# Patient Record
Sex: Female | Born: 1975
Health system: Southern US, Community
[De-identification: ages and names within clinical notes are randomized; demographics above are authoritative.]

## PROBLEM LIST (undated history)

## (undated) DIAGNOSIS — D649 Anemia, unspecified: Secondary | ICD-10-CM

## (undated) HISTORY — DX: Anemia, unspecified: D64.9

---

## 2007-06-23 HISTORY — PX: TUBAL LIGATION: SHX77

## 2015-08-25 ENCOUNTER — Ambulatory Visit (INDEPENDENT_AMBULATORY_CARE_PROVIDER_SITE_OTHER): Payer: BLUE CROSS/BLUE SHIELD | Admitting: Family Medicine

## 2015-08-25 ENCOUNTER — Encounter: Payer: Self-pay | Admitting: Family Medicine

## 2015-08-25 VITALS — BP 132/86 | HR 92 | Temp 98.5°F | Resp 20 | Ht 63.5 in | Wt 128.0 lb

## 2015-08-25 DIAGNOSIS — Z1239 Encounter for other screening for malignant neoplasm of breast: Secondary | ICD-10-CM

## 2015-08-25 DIAGNOSIS — Z23 Encounter for immunization: Secondary | ICD-10-CM

## 2015-08-25 DIAGNOSIS — D649 Anemia, unspecified: Secondary | ICD-10-CM | POA: Insufficient documentation

## 2015-08-25 DIAGNOSIS — Z0000 Encounter for general adult medical examination without abnormal findings: Secondary | ICD-10-CM | POA: Diagnosis not present

## 2015-08-25 DIAGNOSIS — K649 Unspecified hemorrhoids: Secondary | ICD-10-CM | POA: Insufficient documentation

## 2015-08-25 DIAGNOSIS — L659 Nonscarring hair loss, unspecified: Secondary | ICD-10-CM | POA: Diagnosis not present

## 2015-08-25 DIAGNOSIS — Z7189 Other specified counseling: Secondary | ICD-10-CM

## 2015-08-25 DIAGNOSIS — Z131 Encounter for screening for diabetes mellitus: Secondary | ICD-10-CM | POA: Insufficient documentation

## 2015-08-25 DIAGNOSIS — Z7689 Persons encountering health services in other specified circumstances: Secondary | ICD-10-CM | POA: Insufficient documentation

## 2015-08-25 LAB — CBC WITH DIFFERENTIAL/PLATELET
Basophils Absolute: 0 10*3/uL (ref 0.0–0.1)
Basophils Relative: 0.6 % (ref 0.0–3.0)
Eosinophils Absolute: 0.1 10*3/uL (ref 0.0–0.7)
Eosinophils Relative: 1 % (ref 0.0–5.0)
HCT: 35.3 % — ABNORMAL LOW (ref 36.0–46.0)
Hemoglobin: 11 g/dL — ABNORMAL LOW (ref 12.0–15.0)
Lymphocytes Relative: 24.6 % (ref 12.0–46.0)
Lymphs Abs: 1.6 10*3/uL (ref 0.7–4.0)
MCHC: 31.1 g/dL (ref 30.0–36.0)
MCV: 76.6 fl — ABNORMAL LOW (ref 78.0–100.0)
Monocytes Absolute: 0.4 10*3/uL (ref 0.1–1.0)
Monocytes Relative: 7 % (ref 3.0–12.0)
Neutro Abs: 4.3 10*3/uL (ref 1.4–7.7)
Neutrophils Relative %: 66.8 % (ref 43.0–77.0)
Platelets: 330 10*3/uL (ref 150.0–400.0)
RBC: 4.61 Mil/uL (ref 3.87–5.11)
RDW: 19.6 % — ABNORMAL HIGH (ref 11.5–15.5)
WBC: 6.4 10*3/uL (ref 4.0–10.5)

## 2015-08-25 LAB — COMPREHENSIVE METABOLIC PANEL
ALT: 9 U/L (ref 0–35)
AST: 14 U/L (ref 0–37)
Albumin: 4.4 g/dL (ref 3.5–5.2)
Alkaline Phosphatase: 54 U/L (ref 39–117)
BUN: 8 mg/dL (ref 6–23)
CO2: 26 mEq/L (ref 19–32)
Calcium: 9.5 mg/dL (ref 8.4–10.5)
Chloride: 105 mEq/L (ref 96–112)
Creatinine, Ser: 0.75 mg/dL (ref 0.40–1.20)
GFR: 90.65 mL/min (ref >60.00)
Glucose, Bld: 80 mg/dL (ref 70–99)
Potassium: 4.4 mEq/L (ref 3.5–5.1)
Sodium: 140 mEq/L (ref 135–145)
Total Bilirubin: 0.4 mg/dL (ref 0.2–1.2)
Total Protein: 7.3 g/dL (ref 6.0–8.3)

## 2015-08-25 LAB — VITAMIN D 25 HYDROXY (VIT D DEFICIENCY, FRACTURES): VITD: 32.41 ng/mL (ref 30.00–100.00)

## 2015-08-25 LAB — TSH: TSH: 1.14 u[IU]/mL (ref 0.35–4.50)

## 2015-08-25 LAB — VITAMIN B12: Vitamin B-12: 248 pg/mL (ref 211–911)

## 2015-08-25 MED ORDER — HYDROCORTISONE ACETATE 25 MG RE SUPP: 25.0000 mg | Freq: Two times a day (BID) | RECTAL | Status: DC

## 2015-08-25 MED ORDER — HYDROCORTISONE 2.5 % RE CREA: 1.0000 "application " | TOPICAL_CREAM | Freq: Two times a day (BID) | RECTAL | Status: DC

## 2015-08-25 NOTE — Progress Notes (Signed)
Subjective:    Patient ID: Ashlee Bean, female    DOB: Apr 13, 1976, 39 y.o.   MRN: [id number]  HPI  Patient presents for new patient establishment with multiple complaints. All past medical history, surgical history, allergies, family history, immunizations and social history was obtained from the patient today and entered into the electronic medical record. Records are requested from her prior PCP, and will be reviewed at the time they are received. All medical records will be updated at that time.  Irregular menstrual cycle: Patient states she normally has a regular menstrual cycle, occurring once per month. However she states last month she had 2 menstrual cycles. She is not on any birth control.  Hemorrhoids/fecal incontinence: Patient states that her rectum feels agitated periodically. She has had runny since her last pregnancy, and frequently uses Preparation H wipes. She reports she has noticed sometimes with urination, she'll notice stool in the toilet and did not realize she had a bowel movement. She has also noticed some fecal incontinence on her underwear as well. She endorses some bright red blood after bowel movements, on her toilet paper for that she associated with her hemorrhoid. HAs had some in her pants as well withour realizing, sees some blood from hemorrhoid. Patient has had one vaginal delivery approximately 14 years ago. She states the baby was large and she did have a large vaginal tear. She has been followed by gynecology routinely since, last seen in 2013. She does not recall having rectal exams during her gynecological exams. She had 2 C-sections following her vaginal birth. She reports she has 3 loose stools daily, and this is normal for her.  Anemia: Patient states she has a history of anemia. She has been unable to donate blood on a few occasions secondary to anemia. She has noticed over the past few months increasing fatigue and hair loss. She denies any chest pain,  shortness of breath or dizziness.  Health maintenance:  Colonoscopy: no fhx, routine screening at age 49 Mammogram: Breast cancer mother at age 2, age 52 screen. Ordered today. Cervical cancer screening:2013, no abnormal. No procedures. Patient currently on her menses, will perform gynecological exam with cervical screening and a separate appointment. Immunizations: Tetanus and flu indicated. Infectious disease screening: HIV indicated.   Past Medical History  Diagnosis Date  . Anemia    Allergies  Allergen Reactions  . Sulfa Antibiotics Nausea Only  . Tape Rash   Past Surgical History  Procedure Laterality Date  . Cesarean section     Family History  Problem Relation Age of Onset  . Breast cancer Mother   . Lung cancer Mother   . Diabetes Father   . Throat cancer Maternal Uncle   . Diabetes Paternal Aunt   . Diabetes Paternal Uncle   . Cancer Maternal Grandmother   . Prostate cancer Maternal Grandfather   . Diabetes Paternal Grandmother   . Heart disease Paternal Grandfather    Social History   Social History  . Marital Status: Married    Spouse Name: N/A  . Number of Children: N/A  . Years of Education: N/A   Occupational History  . Not on file.   Social History Main Topics  . Smoking status: Never Smoker   . Smokeless tobacco: Not on file  . Alcohol Use: 0.6 oz/week    1 Standard drinks or equivalent per week  . Drug Use: No  . Sexual Activity:    Partners: Male   Other Topics Concern  .  Not on file   Social History Narrative  . No narrative on file    Review of Systems Negative, with the exception of above mentioned in HPI     Objective:   Physical Exam BP 132/86 mmHg  Pulse 92  Temp(Src) 98.5 F (36.9 C)  Resp 20  Ht 5' 3.5" (1.613 m)  SpO2 100%  LMP 08/24/2015  Body mass index is 22.32 kg/(m^2). Gen: Afebrile. No acute distress. Nontoxic in appearance, well-developed, well-nourished, Caucasian female. Very pleasant. HENT: AT. [place].  Bilateral TM visualized and normal in appearance. MMM, no oral lesions. Bilateral nares without erythema or swelling. Throat without erythema or exudates. No cough on exam, no hoarseness on exam. Good dentition. Eyes:Pupils Equal Round Reactive to light, Extraocular movements intact,  Conjunctiva without redness, discharge or icterus. Neck/lymp/endocrine: Supple, no lymphadenopathy, no thyromegaly CV: RRR no murmur appreciated, no edema, +2/4 P posterior tibialis pulses Chest: CTAB, no wheeze or crackles Abd: Soft. Flat. NTND. BS present. No Masses palpated. No hepatosplenomegaly. No rebound tenderness, no guarding. MSK: No erythema, no soft tissue swelling, no obvious deformities. Full range of motion. Skin: No rashes, purpura or petechiae.  Neuro: Normal gait. PERLA. EOMi. Alert. Oriented x3 Cranial nerves II through XII intact. Muscle strength 5/5 upper and lower extremity. DTRs equal bilaterally. Psych: Normal affect, dress and demeanor. Normal speech. Normal thought content and judgment     Assessment & Plan:  Ashlee Bean is a 39 y.o. presents for new patient establishment, with preventative exam, multiple complaints. 1. Hemorrhoids, unspecified hemorrhoid type - Patient with history of hemorrhoids. Concern with what sounds like fecal incontinence versus fistula. Hygiene encouraged, continue sitz bath. Patient given prescription for Anusol rectal cream and suppository to treat hemorrhoid. She is to follow-up within 3-4 weeks to have gynecological exam, and which we will also evaluate with rectal exam. - hydrocortisone (ANUSOL-HC) 2.5 % rectal cream; Place 1 application rectally 2 (two) times daily.  Dispense: 30 g; Refill: 0 - hydrocortisone (ANUSOL-HC) 25 MG suppository; Place 1 suppository (25 mg total) rectally 2 (two) times daily.  Dispense: 12 suppository; Refill: 0 - Patient was encouraged to take a probiotic, ALign  2. Anemia, unspecified anemia type - Patient currently not taking  iron - CBC w/Diff - Comp Met (CMET)  3. Breast cancer screening - Early mammograms coming indicated with family history. Mammogram ordered today.  4. Hair loss - Vitamin D (25 hydroxy) - B12 - TSH  5. Need for prophylactic vaccination and inoculation against influenza - Flu Vaccine QUAD 36+ mos PF IM (Fluarix & Fluzone Quad PF)  6. Need for prophylactic vaccination with combined diphtheria-tetanus-pertussis (DTP) vaccine - Tdap vaccine greater than or equal to 7yo IM  7. Encounter to establish care/ Visit for preventive health examination - Health maintenance:  Colonoscopy: no fhx, routine screening at age 53 Mammogram: Breast cancer mother at age 20, age 50 screen. Ordered today. Cervical cancer screening:2013, no abnormal. No procedures. Patient currently on her menses, will perform gynecological exam with cervical screening and a separate appointment. Immunizations: Tetanus and flu administered today. Infectious disease screening: HIV indicated. - Fasting lipids placed today, patient to make a lab appointment to have this collected.  AVS on health maintenance provided to patient today.  3-4 weeks for routine gynecological screening, with cervical cancer screening and rectal exam.

## 2015-08-25 NOTE — Patient Instructions (Signed)
Align probiotic for 8 weeks.  I will call you with the labs one they are resulted.  Make an appt for fasting lipids to be drawn at your convenience.  I will call in a cream an ointment for your hemorrhoid.  Appt in 1 month for PAP and rectal exam.  Mammogram ordered.

## 2015-08-26 ENCOUNTER — Telehealth: Payer: Self-pay | Admitting: Family Medicine

## 2015-08-26 DIAGNOSIS — E538 Deficiency of other specified B group vitamins: Secondary | ICD-10-CM

## 2015-08-26 DIAGNOSIS — E559 Vitamin D deficiency, unspecified: Secondary | ICD-10-CM

## 2015-08-26 MED ORDER — FERROUS SULFATE 325 (65 FE) MG PO TBEC: 325.0000 mg | DELAYED_RELEASE_TABLET | Freq: Three times a day (TID) | ORAL | Status: DC

## 2015-08-26 MED ORDER — VITAMIN B-12 1000 MCG PO TABS: ORAL_TABLET | ORAL | Status: DC

## 2015-08-26 MED ORDER — CHOLECALCIFEROL 1.25 MG (50000 UT) PO CAPS: 50000.0000 [IU] | ORAL_CAPSULE | ORAL | Status: DC

## 2015-08-26 NOTE — Telephone Encounter (Signed)
spoke with patient reviewed lab results and instructions. Patient verbalized understanding.

## 2015-08-26 NOTE — Telephone Encounter (Signed)
Please call pt:  - Her b12 is 248, this is rather low. - She is mildly anemic (11) - Her vit D is low normal.  - her thyroid is normal - all of the above can cause some of her symptoms. Stress can also cause some of her symptoms as well as hair loss and irregular menses.  - Pt was encourage to make an appt within 3-4 weeks for gyn/PAP exam, we will discuss labs and irregular menses in more detail at that time.  - I have called in iron supplementation for her to take TID. I have also called in b12 and vit D supplements (instructions on label), we will need to re-check these levels in 12 weeks by lab appt only. Orders placed.

## 2015-08-28 ENCOUNTER — Other Ambulatory Visit: Payer: BLUE CROSS/BLUE SHIELD

## 2015-09-02 ENCOUNTER — Encounter: Payer: Self-pay | Admitting: Family Medicine

## 2015-09-16 ENCOUNTER — Other Ambulatory Visit (HOSPITAL_COMMUNITY)
Admission: RE | Admit: 2015-09-16 | Discharge: 2015-09-16 | Disposition: A | Discharge status: Home / Self Care | Payer: BLUE CROSS/BLUE SHIELD | Source: Ambulatory Visit | Attending: Family Medicine | Admitting: Family Medicine

## 2015-09-16 ENCOUNTER — Encounter: Payer: Self-pay | Admitting: Family Medicine

## 2015-09-16 ENCOUNTER — Ambulatory Visit (INDEPENDENT_AMBULATORY_CARE_PROVIDER_SITE_OTHER): Payer: BLUE CROSS/BLUE SHIELD | Admitting: Family Medicine

## 2015-09-16 VITALS — BP 127/89 | HR 83 | Temp 98.2°F | Resp 20 | Wt 128.8 lb

## 2015-09-16 DIAGNOSIS — Z1151 Encounter for screening for human papillomavirus (HPV): Secondary | ICD-10-CM | POA: Insufficient documentation

## 2015-09-16 DIAGNOSIS — Z0000 Encounter for general adult medical examination without abnormal findings: Secondary | ICD-10-CM

## 2015-09-16 DIAGNOSIS — Z01419 Encounter for gynecological examination (general) (routine) without abnormal findings: Secondary | ICD-10-CM | POA: Diagnosis not present

## 2015-09-16 DIAGNOSIS — Z124 Encounter for screening for malignant neoplasm of cervix: Secondary | ICD-10-CM

## 2015-09-16 DIAGNOSIS — Z114 Encounter for screening for human immunodeficiency virus [HIV]: Secondary | ICD-10-CM | POA: Diagnosis not present

## 2015-09-16 DIAGNOSIS — Z01411 Encounter for gynecological examination (general) (routine) with abnormal findings: Secondary | ICD-10-CM | POA: Insufficient documentation

## 2015-09-16 LAB — LIPID PANEL
Cholesterol: 203 mg/dL — ABNORMAL HIGH (ref 0–200)
HDL: 53.9 mg/dL (ref >39.00)
LDL Cholesterol: 119 mg/dL — ABNORMAL HIGH (ref 0–99)
NonHDL: 149.29
Total CHOL/HDL Ratio: 4
Triglycerides: 151 mg/dL — ABNORMAL HIGH (ref 0.0–149.0)
VLDL: 30.2 mg/dL (ref 0.0–40.0)

## 2015-09-16 NOTE — Patient Instructions (Signed)
I did not see a fistula formation. Your gynecological exam today appeared normal. We will call with your PAP results and labs once available.  You do have a hemorrhoid that is likely irritated. If ypu experience more fecal incontinence, then I would would refer you to a specialist.

## 2015-09-16 NOTE — Progress Notes (Signed)
Subjective:    Patient ID: Ashlee Bean, female    DOB: 1976/01/11, 40 y.o.   MRN: [id number]  HPI   Patient presents for scheduled office visit today for cervical cancer screening/routine gynecological exam.  Well woman exam: Patient states that her menstrual cycles have always been approximate one per month, except for the month of November she did have 2 menstrual cycles, since then she believes that her cycles are regulating back out. She is not on any form of birth control. Patient's last Pap was in 2013, she's had no abnormal Paps in the past. Patient has her mammogram scheduled in 3 days, her mother has a history of breast cancer at age 15. Patient denies any vaginal discharge, dyspareunia or vaginal irritation. Patient has not been screened for HIV. Patient is a married gravida 3 para 3, with 1 vaginal delivery and 2 C-sections following. Patient has had recent hemorrhoid flares with mild fecal incontinence. She was prescribed Anusol suppositories and creams on her last visit, and she states it has improved. She now is only seeing blood when she wipes if she's had multiple stools in a day, and that has slowed down. Patient denies any continued fecal incontinence.  Patient does not perform self breast exams.   Past Medical History  Diagnosis Date  . Anemia     after delivery; sometimes also could not give blood.    Allergies  Allergen Reactions  . Sulfa Antibiotics Nausea Only  . Tape Rash   Social History  Substance Use Topics  . Smoking status: Never Smoker   . Smokeless tobacco: None  . Alcohol Use: 0.6 oz/week    1 Standard drinks or equivalent per week     Review of Systems Negative, with the exception of above mentioned in HPI     Objective:   Physical Exam BP 127/89 mmHg  Pulse 83  Temp(Src) 98.2 F (36.8 C)  Resp 20  Wt 128 lb 12 oz (58.401 kg)  SpO2 99%  LMP 08/24/2015  Body mass index is 22.45 kg/(m^2). Gen: Afebrile. No acute distress. Nontoxic in  appearance, well-developed, well-nourished, Caucasian female. Pleasant. HENT: AT. [place].  MMM.  Eyes:Pupils Equal Round Reactive to light, Extraocular movements intact,  Conjunctiva without redness, discharge or icterus. Abd: Soft. Flat. Nontender, nondistended. Bowel sounds present. No masses palpated. No Rebound or guarding.  Skin: No rashes, purpura or petechiae.  Breasts: breasts appear normal, symmetrical, no tenderness on exam, no suspicious masses, no skin or nipple changes or axillary nodes. GYN:  External genitalia within normal limits, normal hair distribution, no lesions. Urethral meatus normal, no lesions. Vaginal mucosa pink, moist, normal rugae, no lesions. No cystocele or rectocele. Nonfriable cervix without lesions, 2 nabothian cyst present, no discharge,  or bleeding noted on speculum exam.  Bimanual exam revealed nongravid uterus.  No bladder/suprapubic fullness, masses or tenderness. No cervical motion tenderness. No adnexal tenderness or fullness. Anus and perineum 2 small external hemorrhoids, without erythema, swelling or drainage. No bleeding. Rectal exam within normal limits, no fistula formation.     Assessment & Plan:  1. Screening for HIV (human immunodeficiency virus) - Patient amendable to testing today - HIV antibody (with reflex)  2. Encounter for gynecological examination - Pap sent with HPV testing; normal appearing exam - Cytology - PAP  3. Visit for preventive health examination - Lipid panel from prior future collection, patient had forgotten to have collected her to visit. - Lipid panel  4. Hemorrhoids: 2 small external hemorrhoids do  not appear inflamed or bleeding. Rectal exam within normal limits. Continue hemorrhoid treatment as needed. Stool should remain soft but not diarrhea.

## 2015-09-17 LAB — CYTOLOGY - PAP

## 2015-09-17 LAB — HIV ANTIBODY (ROUTINE TESTING W REFLEX): HIV 1&2 Ab, 4th Generation: NONREACTIVE

## 2015-09-18 ENCOUNTER — Telehealth: Payer: Self-pay | Admitting: Family Medicine

## 2015-09-18 ENCOUNTER — Ambulatory Visit
Admission: RE | Admit: 2015-09-18 | Discharge: 2015-09-18 | Disposition: A | Discharge status: Home / Self Care | Payer: BLUE CROSS/BLUE SHIELD | Source: Ambulatory Visit | Attending: Family Medicine | Admitting: Family Medicine

## 2015-09-18 DIAGNOSIS — K649 Unspecified hemorrhoids: Secondary | ICD-10-CM

## 2015-09-18 DIAGNOSIS — R159 Full incontinence of feces: Secondary | ICD-10-CM

## 2015-09-18 DIAGNOSIS — D649 Anemia, unspecified: Secondary | ICD-10-CM

## 2015-09-18 DIAGNOSIS — Z1239 Encounter for other screening for malignant neoplasm of breast: Secondary | ICD-10-CM

## 2015-09-18 NOTE — Telephone Encounter (Signed)
Please call pt: - Her PAP showed some changes. It is not positive for any type of cancers. When a PAP comes back with "ASC-US" (atypical squamous cells of undetermined significance)" and a negative HPV co-testing (which she had), the recommendation is repeat PAP at one year.  - Her vit D was low normal (32) she should take a daily OTC vit D daily (800- 1000u).

## 2015-09-18 NOTE — Telephone Encounter (Signed)
Pt has had difficulties with mild anemia (chronic), iron deficiency and fecal incontinence with hemorrhoids.  - No fhx of colon cancer.  - refer to GI for further eval and treatment

## 2015-09-18 NOTE — Telephone Encounter (Signed)
Referral in place

## 2015-09-18 NOTE — Telephone Encounter (Signed)
Spoke with patient reviewed PAP results and instructions to take OTC vit D after completing the D 50,000 . Patient verbalized understanding of all instructions. Patient is requesting referral to GI as discussed in her last OV.Please advise

## 2015-09-21 ENCOUNTER — Encounter: Payer: Self-pay | Admitting: Gastroenterology

## 2015-09-21 ENCOUNTER — Other Ambulatory Visit: Payer: Self-pay | Admitting: Family Medicine

## 2015-09-21 DIAGNOSIS — R928 Other abnormal and inconclusive findings on diagnostic imaging of breast: Secondary | ICD-10-CM

## 2015-09-24 ENCOUNTER — Ambulatory Visit
Admission: RE | Admit: 2015-09-24 | Discharge: 2015-09-24 | Disposition: A | Discharge status: Home / Self Care | Payer: BLUE CROSS/BLUE SHIELD | Source: Ambulatory Visit | Attending: Family Medicine | Admitting: Family Medicine

## 2015-09-24 DIAGNOSIS — R928 Other abnormal and inconclusive findings on diagnostic imaging of breast: Secondary | ICD-10-CM | POA: Insufficient documentation

## 2015-11-10 ENCOUNTER — Other Ambulatory Visit (INDEPENDENT_AMBULATORY_CARE_PROVIDER_SITE_OTHER): Payer: BLUE CROSS/BLUE SHIELD

## 2015-11-10 ENCOUNTER — Ambulatory Visit (INDEPENDENT_AMBULATORY_CARE_PROVIDER_SITE_OTHER): Payer: BLUE CROSS/BLUE SHIELD | Admitting: Gastroenterology

## 2015-11-10 ENCOUNTER — Encounter: Payer: Self-pay | Admitting: Gastroenterology

## 2015-11-10 VITALS — BP 122/84 | HR 70 | Ht 63.0 in | Wt 158.8 lb

## 2015-11-10 DIAGNOSIS — D509 Iron deficiency anemia, unspecified: Secondary | ICD-10-CM | POA: Diagnosis not present

## 2015-11-10 DIAGNOSIS — R197 Diarrhea, unspecified: Secondary | ICD-10-CM

## 2015-11-10 LAB — IGA: IgA: 174 mg/dL (ref 68–378)

## 2015-11-10 MED ORDER — HYOSCYAMINE SULFATE ER 0.375 MG PO TB12: 0.3750 mg | ORAL_TABLET | Freq: Two times a day (BID) | ORAL | Status: DC

## 2015-11-10 NOTE — Patient Instructions (Addendum)
Food Guidelines for a sensitive stomach  Many people have difficulty digesting certain foods, causing a variety of distressing and embarrassing symptoms such as abdominal pain, bloating and gas.  These foods may need to be avoided or consumed in small amounts.  Here are some tips that might be helpful for you.  1.   Lactose intolerance is the difficulty or complete inability to digest lactose, the natural sugar in milk and anything made from milk.  This condition is harmless, common, and can begin any time during life.  Some people can digest a modest amount of lactose while others cannot tolerate any.  Also, not all dairy products contain equal amounts of lactose.  For example, hard cheeses such as parmesan have less lactose than soft cheeses such as cheddar.  Yogurt has less lactose than milk or cheese.  Many packaged foods (even many brands of bread) have milk, so read ingredient lists carefully.  It is difficult to test for lactose intolerance, so just try avoiding lactose as much as possible for a week and see what happens with your symptoms.  If you seem to be lactose intolerant, the best plan is to avoid it (but make sure you get calcium from another source).  The next best thing is to use lactase enzyme supplements, available over the counter everywhere.  Just know that many lactose intolerant people need to take several tablets with each serving of dairy to avoid symptoms.  Lastly, a lot of restaurant food is made with milk or butter.  Many are things you might not suspect, such as mashed potatoes, rice and pasta (cooked with butter) and "grilled" items.  If you are lactose intolerant, it never hurts to ask your server what has milk or butter.  2.   Fiber is an important part of your diet, but not all fiber is well-tolerated.  Insoluble fiber such as bran is often consumed by normal gut bacteria and converted into gas.  Soluble fiber such as oats, squash, carrots and green beans are typically  tolerated better.  3.   Some types of carbohydrates can be poorly digested.  Examples include: fructose (apples, cherries, pears, raisins and other dried fruits), fructans (onions, zucchini, large amounts of wheat), sorbitol/mannitol/xylitol and sucralose/Splenda (common artificial sweeteners), and raffinose (lentils, broccoli, cabbage, asparagus, brussel sprouts, many types of beans).  Do a Development worker, community for [employer] and you will find helpful information. Beano, a dietary supplement, will often help with raffinose-containing foods.  As with lactase tablets, you may need several per serving.  4.   Whenever possible, avoid processed food&meats and chemical additives.  High fructose corn syrup, a common sweetener, may be difficult to digest.  Eggs and soy (comes from the soybean, and added to many foods now) are the other most common bloating/gassy foods.  - Dr. [facility]  Your physician has requested that you go to the basement for the following lab work before leaving today:IGA. TTG  Thank you for choosing  GI  Dr Wilfrid Lund III

## 2015-11-10 NOTE — Progress Notes (Signed)
Gastroenterology and Hepatology Consult Note:  History: Ashlee Bean 11/10/2015  Referring physician: Howard Pouch, DO  Reason for consult/chief complaint: Bowel habit change; Diarrhea; and fecal urgency   Subjective HPI:  This is the initial office consult for a 40 year old woman who complains of lower abdominal pain diarrhea and urgency. She describes about 4-6 months of these symptoms, they were of unclear onset. In retrospect, she thinks it is been going on for years to a lesser degree, but just worse in the last several months. She's had occasional blood on the paper but no frank rectal bleeding. She was especially bothered by an episode of incontinence because she could not get to the toilet on time after feeling urgency for a BM. She has tried no particular dietary changes are meds for this. She has some bandlike crampy abdominal pain that comes and goes without clear triggers or relieving factors. Generally, appetite is been good and weight stable. She denies dysphagia odynophagia or early satiety nausea or vomiting. ROS:  Review of Systems  Constitutional: Negative for appetite change and unexpected weight change.  HENT: Negative for mouth sores and voice change.   Eyes: Negative for pain and redness.  Respiratory: Negative for cough and shortness of breath.   Cardiovascular: Negative for chest pain and palpitations.  Genitourinary: Positive for menstrual problem. Negative for dysuria and hematuria.  Musculoskeletal: Negative for myalgias and arthralgias.  Skin: Negative for pallor and rash.  Neurological: Negative for weakness and headaches.  Hematological: Negative for adenopathy.   Heavy menses   Past Medical History: Past Medical History  Diagnosis Date  . Anemia     after delivery; sometimes also could not give blood.      Past Surgical History: Past Surgical History  Procedure Laterality Date  . Cesarean section       Family History: Family History   Problem Relation Age of Onset  . Breast cancer Mother 92  . Lung cancer Mother 37  . Diabetes Father   . Throat cancer Maternal Uncle   . Diabetes Paternal Aunt   . Diabetes Paternal Uncle   . Cancer Maternal Grandmother   . Prostate cancer Maternal Grandfather   . Diabetes Paternal Grandmother   . Heart disease Paternal Grandfather     Social History: Social History   Social History  . Marital Status: Married    Spouse Name: N/A  . Number of Children: N/A  . Years of Education: N/A   Social History Main Topics  . Smoking status: Never Smoker   . Smokeless tobacco: None  . Alcohol Use: 0.6 oz/week    1 Standard drinks or equivalent per week  . Drug Use: No  . Sexual Activity:    Partners: Male   Other Topics Concern  . None   Social History Narrative    Allergies: Allergies  Allergen Reactions  . Sulfa Antibiotics Nausea Only  . Tape Rash    Outpatient Meds: Current Outpatient Prescriptions  Medication Sig Dispense Refill  . Cholecalciferol 50000 UNITS capsule Take 1 capsule (50,000 Units total) by mouth once a week. 12 capsule 0  . ferrous sulfate 325 (65 FE) MG EC tablet Take 1 tablet (325 mg total) by mouth 3 (three) times daily with meals. 90 tablet 3  . hydrocortisone (ANUSOL-HC) 2.5 % rectal cream Place 1 application rectally 2 (two) times daily. 30 g 0  . vitamin B-12 (CYANOCOBALAMIN) 1000 MCG tablet 1000 mcg daily for 7 days, then 1000 mcg weekly 18 tablet 0  .  hyoscyamine (LEVBID) 0.375 MG 12 hr tablet Take 1 tablet (0.375 mg total) by mouth 2 (two) times daily. 60 tablet 1   No current facility-administered medications for this visit.      ___________________________________________________________________ Objective  Exam:  BP 122/84 mmHg  Pulse 70  Ht 5\' 3"  (1.6 m)  Wt 158 lb 12.8 oz (72.031 kg)  BMI 28.14 kg/m2   General: this is a well-appearing white woman with good muscle mass, not acutely ill. She is somewhat anxious, which she  reports is because she is uncomfortable discussing this topic.  Eyes: sclera anicteric, no redness  ENT: oral mucosa moist without lesions, no cervical or supraclavicular lymphadenopathy, good dentition  CV: RRR without murmur, S1/S2, no JVD, no peripheral edema  Resp: clear to auscultation bilaterally, normal RR and effort noted  GI: soft, no tenderness, with active bowel sounds. No guarding or palpable organomegaly noted.  Skin; warm and dry, no rash or jaundice noted  Neuro: awake, alert and oriented x 3. Normal gross motor function and fluent speech Good muscle mass Labs:  Lab Results  Component Value Date   WBC 6.4 08/25/2015   HGB 11.0* 08/25/2015   HCT 35.3* 08/25/2015   MCV 76.6* 08/25/2015   PLT 330.0 08/25/2015      Assessment: Encounter Diagnoses  Name Primary?  . Diarrhea, unspecified type Yes  . Anemia, iron deficiency     This sounds most like IBS with benign anal bleeding, less likely proctitis. The anemia seems most likely from menorrhagia.  Plan:  She has opted for a conservative approach to start, with some dietary changes, when necessary Levbid, and will call us in a few weeks with an update. I offered a colonoscopy to rule out IBD, but she would like to wait and see how the above interventions might help.  Thank you for the courtesy of this consult.  Please call me with any questions or concerns.  Nelida Meuse III

## 2015-11-11 LAB — TISSUE TRANSGLUTAMINASE, IGA: Tissue Transglutaminase Ab, IgA: 1 U/mL (ref <4)

## 2016-03-29 ENCOUNTER — Other Ambulatory Visit: Payer: Self-pay | Admitting: Family Medicine

## 2016-03-29 DIAGNOSIS — N6002 Solitary cyst of left breast: Secondary | ICD-10-CM

## 2016-04-04 ENCOUNTER — Ambulatory Visit
Admission: RE | Admit: 2016-04-04 | Discharge: 2016-04-04 | Disposition: A | Discharge status: Home / Self Care | Payer: BLUE CROSS/BLUE SHIELD | Source: Ambulatory Visit | Attending: Family Medicine | Admitting: Family Medicine

## 2016-04-04 DIAGNOSIS — N6002 Solitary cyst of left breast: Secondary | ICD-10-CM

## 2016-09-16 ENCOUNTER — Ambulatory Visit (INDEPENDENT_AMBULATORY_CARE_PROVIDER_SITE_OTHER): Payer: BLUE CROSS/BLUE SHIELD | Admitting: Family Medicine

## 2016-09-16 ENCOUNTER — Encounter: Payer: Self-pay | Admitting: Family Medicine

## 2016-09-16 ENCOUNTER — Other Ambulatory Visit (HOSPITAL_COMMUNITY)
Admission: RE | Admit: 2016-09-16 | Discharge: 2016-09-16 | Disposition: A | Discharge status: Home / Self Care | Payer: BLUE CROSS/BLUE SHIELD | Source: Ambulatory Visit | Attending: Family Medicine | Admitting: Family Medicine

## 2016-09-16 VITALS — BP 118/80 | HR 80 | Temp 98.5°F | Resp 18 | Ht 63.0 in | Wt 162.5 lb

## 2016-09-16 DIAGNOSIS — Z124 Encounter for screening for malignant neoplasm of cervix: Secondary | ICD-10-CM | POA: Diagnosis not present

## 2016-09-16 DIAGNOSIS — Z01411 Encounter for gynecological examination (general) (routine) with abnormal findings: Secondary | ICD-10-CM | POA: Insufficient documentation

## 2016-09-16 DIAGNOSIS — Z131 Encounter for screening for diabetes mellitus: Secondary | ICD-10-CM

## 2016-09-16 DIAGNOSIS — E785 Hyperlipidemia, unspecified: Secondary | ICD-10-CM | POA: Diagnosis not present

## 2016-09-16 DIAGNOSIS — E559 Vitamin D deficiency, unspecified: Secondary | ICD-10-CM | POA: Diagnosis not present

## 2016-09-16 DIAGNOSIS — Z6828 Body mass index (BMI) 28.0-28.9, adult: Secondary | ICD-10-CM

## 2016-09-16 DIAGNOSIS — Z0000 Encounter for general adult medical examination without abnormal findings: Secondary | ICD-10-CM

## 2016-09-16 DIAGNOSIS — E663 Overweight: Secondary | ICD-10-CM | POA: Insufficient documentation

## 2016-09-16 DIAGNOSIS — Z1151 Encounter for screening for human papillomavirus (HPV): Secondary | ICD-10-CM | POA: Diagnosis present

## 2016-09-16 DIAGNOSIS — Z1329 Encounter for screening for other suspected endocrine disorder: Secondary | ICD-10-CM | POA: Diagnosis not present

## 2016-09-16 DIAGNOSIS — Z01419 Encounter for gynecological examination (general) (routine) without abnormal findings: Secondary | ICD-10-CM

## 2016-09-16 DIAGNOSIS — Z130 Encounter for screening for diseases of the blood and blood-forming organs and certain disorders involving the immune mechanism: Secondary | ICD-10-CM

## 2016-09-16 LAB — CBC WITH DIFFERENTIAL/PLATELET
Basophils Absolute: 0 10*3/uL (ref 0.0–0.1)
Basophils Relative: 0.4 % (ref 0.0–3.0)
Eosinophils Absolute: 0.1 10*3/uL (ref 0.0–0.7)
Eosinophils Relative: 1.3 % (ref 0.0–5.0)
HCT: 36.9 % (ref 36.0–46.0)
Hemoglobin: 12.2 g/dL (ref 12.0–15.0)
Lymphocytes Relative: 30.5 % (ref 12.0–46.0)
Lymphs Abs: 1.9 10*3/uL (ref 0.7–4.0)
MCHC: 33.2 g/dL (ref 30.0–36.0)
MCV: 82.8 fl (ref 78.0–100.0)
Monocytes Absolute: 0.4 10*3/uL (ref 0.1–1.0)
Monocytes Relative: 7.3 % (ref 3.0–12.0)
Neutro Abs: 3.7 10*3/uL (ref 1.4–7.7)
Neutrophils Relative %: 60.5 % (ref 43.0–77.0)
Platelets: 318 10*3/uL (ref 150.0–400.0)
RBC: 4.45 Mil/uL (ref 3.87–5.11)
RDW: 15.8 % — ABNORMAL HIGH (ref 11.5–15.5)
WBC: 6.1 10*3/uL (ref 4.0–10.5)

## 2016-09-16 LAB — LIPID PANEL
Cholesterol: 216 mg/dL — ABNORMAL HIGH (ref 0–200)
HDL: 53.8 mg/dL (ref >39.00)
LDL Cholesterol: 144 mg/dL — ABNORMAL HIGH (ref 0–99)
NonHDL: 162.49
Total CHOL/HDL Ratio: 4
Triglycerides: 90 mg/dL (ref 0.0–149.0)
VLDL: 18 mg/dL (ref 0.0–40.0)

## 2016-09-16 LAB — BASIC METABOLIC PANEL
BUN: 10 mg/dL (ref 6–23)
CO2: 28 mEq/L (ref 19–32)
Calcium: 9.2 mg/dL (ref 8.4–10.5)
Chloride: 105 mEq/L (ref 96–112)
Creatinine, Ser: 0.82 mg/dL (ref 0.40–1.20)
GFR: 81.36 mL/min (ref >60.00)
Glucose, Bld: 84 mg/dL (ref 70–99)
Potassium: 4.1 mEq/L (ref 3.5–5.1)
Sodium: 140 mEq/L (ref 135–145)

## 2016-09-16 LAB — TSH: TSH: 1.4 u[IU]/mL (ref 0.35–4.50)

## 2016-09-16 LAB — HEMOGLOBIN A1C
Hgb A1c MFr Bld: 5.5 % (ref <5.7)
Mean Plasma Glucose: 111 mg/dL

## 2016-09-16 LAB — VITAMIN D 25 HYDROXY (VIT D DEFICIENCY, FRACTURES): VITD: 23.05 ng/mL — ABNORMAL LOW (ref 30.00–100.00)

## 2016-09-16 NOTE — Patient Instructions (Signed)
Health Maintenance, Female Introduction Adopting a healthy lifestyle and getting preventive care can go a long way to promote health and wellness. Talk with your health care provider about what schedule of regular examinations is right for you. This is a good chance for you to check in with your provider about disease prevention and staying healthy. In between checkups, there are plenty of things you can do on your own. Experts have done a lot of research about which lifestyle changes and preventive measures are most likely to keep you healthy. Ask your health care provider for more information. Weight and diet Eat a healthy diet  Be sure to include plenty of vegetables, fruits, low-fat dairy products, and lean protein.  Do not eat a lot of foods high in solid fats, added sugars, or salt.  Get regular exercise. This is one of the most important things you can do for your health.  Most adults should exercise for at least 150 minutes each week. The exercise should increase your heart rate and make you sweat (moderate-intensity exercise).  Most adults should also do strengthening exercises at least twice a week. This is in addition to the moderate-intensity exercise. Maintain a healthy weight  Body mass index (BMI) is a measurement that can be used to identify possible weight problems. It estimates body fat based on height and weight. Your health care provider can help determine your BMI and help you achieve or maintain a healthy weight.  For females 63 years of age and older:  A BMI below 18.5 is considered underweight.  A BMI of 18.5 to 24.9 is normal.  A BMI of 25 to 29.9 is considered overweight.  A BMI of 30 and above is considered obese. Watch levels of cholesterol and blood lipids  You should start having your blood tested for lipids and cholesterol at 42 years of age, then have this test every 5 years.  You may need to have your cholesterol levels checked more often if:  Your  lipid or cholesterol levels are high.  You are older than 42 years of age.  You are at high risk for heart disease. Cancer screening Lung Cancer  Lung cancer screening is recommended for adults 56-22 years old who are at high risk for lung cancer because of a history of smoking.  A yearly low-dose CT scan of the lungs is recommended for people who:  Currently smoke.  Have quit within the past 15 years.  Have at least a 30-pack-year history of smoking. A pack year is smoking an average of one pack of cigarettes a day for 1 year.  Yearly screening should continue until it has been 15 years since you quit.  Yearly screening should stop if you develop a health problem that would prevent you from having lung cancer treatment. Breast Cancer  Practice breast self-awareness. This means understanding how your breasts normally appear and feel.  It also means doing regular breast self-exams. Let your health care provider know about any changes, no matter how small.  If you are in your 20s or 30s, you should have a clinical breast exam (CBE) by a health care provider every 1-3 years as part of a regular health exam.  If you are 35 or older, have a CBE every year. Also consider having a breast X-ray (mammogram) every year.  If you have a family history of breast cancer, talk to your health care provider about genetic screening.  If you are at high risk for breast cancer,  talk to your health care provider about having an MRI and a mammogram every year.  Breast cancer gene (BRCA) assessment is recommended for women who have family members with BRCA-related cancers. BRCA-related cancers include:  Breast.  Ovarian.  Tubal.  Peritoneal cancers.  Results of the assessment will determine the need for genetic counseling and BRCA1 and BRCA2 testing. Cervical Cancer  Your health care provider may recommend that you be screened regularly for cancer of the pelvic organs (ovaries, uterus, and  vagina). This screening involves a pelvic examination, including checking for microscopic changes to the surface of your cervix (Pap test). You may be encouraged to have this screening done every 3 years, beginning at age 21.  For women ages 30-65, health care providers may recommend pelvic exams and Pap testing every 3 years, or they may recommend the Pap and pelvic exam, combined with testing for human papilloma virus (HPV), every 5 years. Some types of HPV increase your risk of cervical cancer. Testing for HPV may also be done on women of any age with unclear Pap test results.  Other health care providers may not recommend any screening for nonpregnant women who are considered low risk for pelvic cancer and who do not have symptoms. Ask your health care provider if a screening pelvic exam is right for you.  If you have had past treatment for cervical cancer or a condition that could lead to cancer, you need Pap tests and screening for cancer for at least 20 years after your treatment. If Pap tests have been discontinued, your risk factors (such as having a new sexual partner) need to be reassessed to determine if screening should resume. Some women have medical problems that increase the chance of getting cervical cancer. In these cases, your health care provider may recommend more frequent screening and Pap tests. Colorectal Cancer  This type of cancer can be detected and often prevented.  Routine colorectal cancer screening usually begins at 42 years of age and continues through 42 years of age.  Your health care provider may recommend screening at an earlier age if you have risk factors for colon cancer.  Your health care provider may also recommend using home test kits to check for hidden blood in the stool.  A small camera at the end of a tube can be used to examine your colon directly (sigmoidoscopy or colonoscopy). This is done to check for the earliest forms of colorectal  cancer.  Routine screening usually begins at age 50.  Direct examination of the colon should be repeated every 5-10 years through 42 years of age. However, you may need to be screened more often if early forms of precancerous polyps or small growths are found. Skin Cancer  Check your skin from head to toe regularly.  Tell your health care provider about any new moles or changes in moles, especially if there is a change in a mole's shape or color.  Also tell your health care provider if you have a mole that is larger than the size of a pencil eraser.  Always use sunscreen. Apply sunscreen liberally and repeatedly throughout the day.  Protect yourself by wearing long sleeves, pants, a wide-brimmed hat, and sunglasses whenever you are outside. Heart disease, diabetes, and high blood pressure  High blood pressure causes heart disease and increases the risk of stroke. High blood pressure is more likely to develop in:  People who have blood pressure in the high end of the normal range (130-139/85-89 mm Hg).    People who are overweight or obese.  People who are African American.  If you are 18-39 years of age, have your blood pressure checked every 3-5 years. If you are 40 years of age or older, have your blood pressure checked every year. You should have your blood pressure measured twice-once when you are at a hospital or clinic, and once when you are not at a hospital or clinic. Record the average of the two measurements. To check your blood pressure when you are not at a hospital or clinic, you can use:  An automated blood pressure machine at a pharmacy.  A home blood pressure monitor.  If you are between 55 years and 79 years old, ask your health care provider if you should take aspirin to prevent strokes.  Have regular diabetes screenings. This involves taking a blood sample to check your fasting blood sugar level.  If you are at a normal weight and have a low risk for diabetes,  have this test once every three years after 42 years of age.  If you are overweight and have a high risk for diabetes, consider being tested at a younger age or more often. Preventing infection Hepatitis B  If you have a higher risk for hepatitis B, you should be screened for this virus. You are considered at high risk for hepatitis B if:  You were born in a country where hepatitis B is common. Ask your health care provider which countries are considered high risk.  Your parents were born in a high-risk country, and you have not been immunized against hepatitis B (hepatitis B vaccine).  You have HIV or AIDS.  You use needles to inject street drugs.  You live with someone who has hepatitis B.  You have had sex with someone who has hepatitis B.  You get hemodialysis treatment.  You take certain medicines for conditions, including cancer, organ transplantation, and autoimmune conditions. Hepatitis C  Blood testing is recommended for:  Everyone born from 1945 through 1965.  Anyone with known risk factors for hepatitis C. Sexually transmitted infections (STIs)  You should be screened for sexually transmitted infections (STIs) including gonorrhea and chlamydia if:  You are sexually active and are younger than 42 years of age.  You are older than 42 years of age and your health care provider tells you that you are at risk for this type of infection.  Your sexual activity has changed since you were last screened and you are at an increased risk for chlamydia or gonorrhea. Ask your health care provider if you are at risk.  If you do not have HIV, but are at risk, it may be recommended that you take a prescription medicine daily to prevent HIV infection. This is called pre-exposure prophylaxis (PrEP). You are considered at risk if:  You are sexually active and do not regularly use condoms or know the HIV status of your partner(s).  You take drugs by injection.  You are sexually  active with a partner who has HIV. Talk with your health care provider about whether you are at high risk of being infected with HIV. If you choose to begin PrEP, you should first be tested for HIV. You should then be tested every 3 months for as long as you are taking PrEP. Pregnancy  If you are premenopausal and you may become pregnant, ask your health care provider about preconception counseling.  If you may become pregnant, take 400 to 800 micrograms (mcg) of folic acid   every day.  If you want to prevent pregnancy, talk to your health care provider about birth control (contraception). Osteoporosis and menopause  Osteoporosis is a disease in which the bones lose minerals and strength with aging. This can result in serious bone fractures. Your risk for osteoporosis can be identified using a bone density scan.  If you are 65 years of age or older, or if you are at risk for osteoporosis and fractures, ask your health care provider if you should be screened.  Ask your health care provider whether you should take a calcium or vitamin D supplement to lower your risk for osteoporosis.  Menopause may have certain physical symptoms and risks.  Hormone replacement therapy may reduce some of these symptoms and risks. Talk to your health care provider about whether hormone replacement therapy is right for you. Follow these instructions at home:  Schedule regular health, dental, and eye exams.  Stay current with your immunizations.  Do not use any tobacco products including cigarettes, chewing tobacco, or electronic cigarettes.  If you are pregnant, do not drink alcohol.  If you are breastfeeding, limit how much and how often you drink alcohol.  Limit alcohol intake to no more than 1 drink per day for nonpregnant women. One drink equals 12 ounces of beer, 5 ounces of wine, or 1 ounces of hard liquor.  Do not use street drugs.  Do not share needles.  Ask your health care provider for  help if you need support or information about quitting drugs.  Tell your health care provider if you often feel depressed.  Tell your health care provider if you have ever been abused or do not feel safe at home. This information is not intended to replace advice given to you by your health care provider. Make sure you discuss any questions you have with your health care provider. Document Released: 03/07/2011 Document Revised: 01/28/2016 Document Reviewed: 05/26/2015  2017 [employer]  Please help us help you:  It is a privilege to be able to take care of great patients such as yourself. We are honored you have chosen [facility] for your Primary Care home. Below you will find basic instructions that you may need to access in the future. Please help us help you by reading the instructions, which cover many of the frequent questions we experience.   Prescription refills and request:  -In order to allow more efficient response time, please call your pharmacy for all refills. They will forward the request electronically to us. This allows for the quickest possible response. Request left on a nurse line can take longer to refill, since these are checked as time allows between office patients and other phone calls.  - refill request can take up to 3-5 working days to complete.  - If request is sent electronically and request is appropiate, it is usually completed in 1-2 business days.  - all patients will need to be seen routinely for all chronic medical conditions requiring prescription medications (see follow-up below). If you are overdue for follow up on your condition, you will be asked to make an appointment and we will call in enough medication to cover you until your appointment (up to 30 days).  - all controlled substances will require a face to face visit to request/refill.  - if you desire your prescriptions to go through a new pharmacy, and have an active script at original pharmacy, you  will need to call your pharmacy and have scripts transferred to   new pharmacy. This is completed between the pharmacy locations and not by your provider.    Results: If any images or labs were ordered, it can take up to 1 week to get results depending on the test ordered and the lab/facility running and resulting the test. - Normal or stable results, which do not need further discussion, will be released to your mychart immediately with attached note to you. A call will not be generated for normal results. Please make certain to sign up for mychart. If you have questions on how to activate your mychart you can call the front office.  - If your results need further discussion, our office will attempt to contact you via phone, and if unable to reach you after 2 attempts, we will release your abnormal result to your mychart with instructions.  - All results will be automatically released in mychart after 1 week.  - Your provider will provide you with explanation and instruction on all relevant material in your results. Please keep in mind, results and labs may appear confusing or abnormal to the untrained eye, but it does not mean they are actually abnormal for you personally. If you have any questions about your results that are not covered, or you desire more detailed explanation than what was provided, you should make an appointment with your provider to do so.   Our office handles many outgoing and incoming calls daily. If we have not contacted you within 1 week about your results, please check your mychart to see if there is a message first and if not, then contact our office.  In helping with this matter, you help decrease call volume, and therefore allow us to be able to respond to patients needs more efficiently.   Acute office visits (sick visit):  An acute visit is intended for a new problem and are scheduled in shorter time slots to allow schedule openings for patients with new problems. This is  the appropriate visit to discuss a new problem. In order to provide you with excellent quality medical care with proper time for you to explain your problem, have an exam and receive treatment with instructions, these appointments should be limited to one new problem per visit. If you experience a new problem, in which you desire to be addressed, please make an acute office visit, we save openings on the schedule to accommodate you. Please do not save your new problem for any other type of visit, let us take care of it properly and quickly for you.   Follow up visits:  Depending on your condition(s) your provider will need to see you routinely in order to provide you with quality care and prescribe medication(s). Most chronic conditions (Example: hypertension, Diabetes, depression/anxiety... etc), require visits a couple times a year. Your provider will instruct you on proper follow up for your personal medical conditions and history. Please make certain to make follow up appointments for your condition as instructed. Failing to do so could result in lapse in your medication treatment/refills. If you request a refill, and are overdue to be seen on a condition, we will always provide you with a 30 day script (once) to allow you time to schedule.    Medicare wellness (well visit): - we have a wonderful Nurse (Kim), that will meet with you and provide you will yearly medicare wellness visits. These visits should occur yearly (can not be scheduled less than 1 calendar year apart) and cover preventive health, immunizations, advance directives and   screenings you are entitled to yearly through your medicare benefits. Do not miss out on your entitled benefits, this is when medicare will pay for these benefits to be ordered for you.  These are strongly encouraged by your provider and is the appropriate type of visit to make certain you are up to date with all preventive health benefits. If you have not had your  medicare wellness exam in the last 12 months, please make certain to schedule one by calling the office and schedule your medicare wellness with Kim as soon as possible.   Yearly physical (well visit):  - Adults are recommended to be seen yearly for physicals. Check with your insurance and date of your last physical, most insurances require one calendar year between physicals. Physicals include all preventive health topics, screenings, medical exam and labs that are appropriate for gender/age and history. You may have fasting labs needed at this visit. This is a well visit (not a sick visit), acute topics should not be covered during this visit.  - Pediatric patients are seen more frequently when they are younger. Your provider will advise you on well child visit timing that is appropriate for your their age. - This is not a medicare wellness visit. Medicare wellness exams do not have an exam portion to the visit. Some medicare companies allow for a physical, some do not allow a yearly physical. If your medicare allows a yearly physical you can schedule the medicare wellness with our nurse Kim and have your physical with your provider after, on the same day. Please check with insurance for your full benefits.   Late Policy/No Shows:  - all new patients should arrive 15-30 minutes earlier than appointment to allow us time  to  obtain all personal demographics,  insurance information and for you to complete office paperwork. - All established patients should arrive 10-15 minutes earlier than appointment time to update all information and be checked in .  - In our best efforts to run on time, if you are late for your appointment you will be asked to either reschedule or if able, we will work you back into the schedule. There will be a wait time to work you back in the schedule,  depending on availability.  - If you are unable to make it to your appointment as scheduled, please call 24 hours ahead of time to  allow us to fill the time slot with someone else who needs to be seen. If you do not cancel your appointment ahead of time, you may be charged a no show fee.   

## 2016-09-16 NOTE — Progress Notes (Signed)
Patient ID: Ashlee Bean, female  DOB: 1976/06/20, 41 y.o.   MRN: [id number] Patient Care Team    Relationship Specialty Notifications Start End  Ma Hillock, DO PCP - General Family Medicine  08/25/15     Subjective:  Ashlee Bean is a 41 y.o.  Female  present for CPE with PAP. All past medical history, surgical history, allergies, family history, immunizations, medications and social history were updated  in the electronic medical record today. All recent labs, ED visits and hospitalizations within the last year were reviewed.  Patient reports her menstrual cycles are every 26-28 days, few days in length. She does experience diarrhea a few days prior to onset. She denies dyspareunia, vaginal irritation or discharge.  Health maintenance:  Colonoscopy: [employer] grandfather in his 18's, screen at 60 Mammogram: scheduling repeat,  Due secondary to abnormal Birads 3 in June. Pt will schedule repeat, she has had notification from breast center to scheduled. FHX breats cancer in mother at age 67. Cervical cancer screening: needs repeated today for ascus, 09/2015. HPV negative. Immunizations: tdap 2016 UTD, Influenza declined flu (encouraged yearly) Infectious disease screening: HIV completed Assistive device: none Oxygen use: none Patient has a Dental home. Hospitalizations/ED visits:  None  Immunization History  Administered Date(s) Administered  . Influenza,inj,Quad PF,36+ Mos 08/25/2015  . Tdap 08/25/2015     Past Medical History:  Diagnosis Date  . Anemia    after delivery; sometimes also could not give blood.    Allergies  Allergen Reactions  . Sulfa Antibiotics Nausea Only  . Tape Rash   Past Surgical History:  Procedure Laterality Date  . CESAREAN SECTION     Family History  Problem Relation Age of Onset  . Breast cancer Mother 18  . Lung cancer Mother 69  . Diabetes Father   . Diabetes Paternal Aunt   . Diabetes Paternal Uncle   . Cancer Maternal Grandmother    . Prostate cancer Maternal Grandfather   . Diabetes Paternal Grandmother   . Heart disease Paternal Grandfather   . Throat cancer Maternal Uncle    Social History   Social History  . Marital status: Married    Spouse name: N/A  . Number of children: N/A  . Years of education: N/A   Occupational History  . Not on file.   Social History Main Topics  . Smoking status: Never Smoker  . Smokeless tobacco: Never Used  . Alcohol use 0.6 oz/week    1 Standard drinks or equivalent per week  . Drug use: No  . Sexual activity: Yes    Partners: Male   Other Topics Concern  . Not on file   Social History Narrative  . No narrative on file   Allergies as of 09/16/2016      Reactions   Sulfa Antibiotics Nausea Only   Tape Rash      Medication List       Accurate as of 09/16/16 10:13 AM. Always use your most recent med list.          vitamin B-12 1000 MCG tablet Commonly known as:  CYANOCOBALAMIN 1000 mcg daily for 7 days, then 1000 mcg weekly        No results found for this or any previous visit (from the past 2160 hour(s)).  Mm Diag Breast Tomo Uni Left  Result Date: 04/04/2016 CLINICAL DATA:  Follow-up probably benign low-density nodule in the medial left breast. This was previously evaluated in January of 2017. No  sonographic correlate was identified on ultrasound exam. The patient is asymptomatic. EXAM: 2D DIGITAL DIAGNOSTIC UNILATERAL LEFT MAMMOGRAM WITH CAD AND ADJUNCT TOMO COMPARISON:  09/24/2015 and 09/18/2015 ACR Breast Density Category b: There are scattered areas of fibroglandular density. FINDINGS: There is a circumscribed low-density nodule in the far medial left breast posteriorly best seen in the CC projection. This nodule measures approximately 7 mm. No new or suspicious mass, distortion, or suspicious microcalcification is identified in the left breast. Mammographic images were processed with CAD. IMPRESSION: Stable probably benign low-density nodule in the  medial left breast. RECOMMENDATION: Bilateral diagnostic mammogram is recommended in January 2018 to complete a 1 year follow-up. I have discussed the findings and recommendations with the patient. Results were also provided in writing at the conclusion of the visit. If applicable, a reminder letter will be sent to the patient regarding the next appointment. BI-RADS CATEGORY  3: Probably benign. Electronically Signed   By: Curlene Dolphin M.D.   On: 04/04/2016 09:21    ROS: 14 pt review of systems performed and negative (unless mentioned in an HPI)  Objective: BP 118/80 (BP Location: Right Arm, Patient Position: Sitting, Cuff Size: Large)   Pulse 80   Temp 98.5 F (36.9 C)   Resp 18   Ht 5\' 3"  (1.6 m)   Wt 162 lb 8 oz (73.7 kg)   LMP 08/26/2016 (Exact Date)   SpO2 100%   BMI 28.79 kg/m  Gen: Afebrile. No acute distress. Nontoxic in appearance, well-developed, well-nourished,  Pleasant caucasian female.  HENT: AT. [place]. Bilateral TM visualized and normal in appearance, normal external auditory canal. MMM, no oral lesions, adequate dentition. Bilateral nares within normal limits. Throat without erythema, ulcerations or exudates. no Cough on exam, no hoarseness on exam. Eyes:Pupils Equal Round Reactive to light, Extraocular movements intact,  Conjunctiva without redness, discharge or icterus. Neck/lymp/endocrine: Supple,no lymphadenopathy, no thyromegaly CV: RRR no murmur, noedema, +2/4 P posterior tibialis pulses.  carotid bruits. No JVD. Chest: CTAB, no wheeze, rhonchi or crackles. Normal  Respiratory effort. good Air movement. Abd: Soft. flat. NTND. BS present. no Masses palpated. No hepatosplenomegaly. No rebound tenderness or guarding. Skin: no rashes, purpura or petechiae. Warm and well-perfused. Skin intact. Neuro/Msk:  Normal gait. PERLA. EOMi. Alert. Oriented x3.  Cranial nerves II through XII intact. Muscle strength 5/5 upper/lower extremity. DTRs equal bilaterally. Psych: Normal  affect, dress and demeanor. Normal speech. Normal thought content and judgment. Breasts: breasts appear normal, symmetrical, no tenderness on exam, no suspicious masses, no skin or nipple changes or axillary nodes. GYN:  External genitalia within normal limits, normal hair distribution, no lesions. Urethral meatus normal, no lesions. Vaginal mucosa pink, moist, normal rugae, no lesions. No cystocele or rectocele. cervix without lesions, no discharge.  No bladder/suprapubic fullness, masses or tenderness. No cervical motion tenderness. No adnexal fullness. Anus and perineum within normal limits, no lesions.   Assessment/plan: Ashlee Bean is a 41 y.o. female present for CPE with PAP  Visit for preventive health examination Patient was encouraged to exercise greater than 150 minutes a week. Patient was encouraged to choose a diet filled with fresh fruits and vegetables, and lean meats. AVS provided to patient today for education/recommendation on gender specific health and safety maintenance. Colonoscopy: [place] grandfather in his 30's, screen at 46 Mammogram: scheduling repeat,  Due secondary to abnormal Birads 3 in June. Pt will schedule repeat, she has had notification from breast center to scheduled. FHX breats cancer in mother at age 13. Cervical cancer  screening: needs repeated today for ascus, 09/2015. HPV negative. Immunizations: tdap 2016 UTD, Influenza declined flu (encouraged yearly) Infectious disease screening: HIV completed Screening for deficiency anemia - CBC w/Diff Cervical cancer screening/routine gyn exam - PAP collected today for ASCUS + 12 months prior.  Thyroid disorder screen - Basic Metabolic Panel (BMET) - TSH Vitamin D deficiency - Vitamin D (25 hydroxy) Hyperlipidemia, unspecified hyperlipidemia type - Basic Metabolic Panel (BMET) - Lipid panel Diabetes mellitus screening/BMI 28 - Hemoglobin A1c   Return in about 1 year (around 09/16/2017) for CPE.  Electronically  signed by: Howard Pouch, DO [place]

## 2016-09-19 ENCOUNTER — Telehealth: Payer: Self-pay | Admitting: Family Medicine

## 2016-09-19 MED ORDER — VITAMIN D (ERGOCALCIFEROL) 1.25 MG (50000 UNIT) PO CAPS: 50000.0000 [IU] | ORAL_CAPSULE | ORAL | 0 refills | Status: DC

## 2016-09-19 NOTE — Telephone Encounter (Signed)
Left message on voicemail for patient to return call. 

## 2016-09-19 NOTE — Telephone Encounter (Signed)
Please call pt: - her labs are normal with the exception of her vit d and her cholesterol is mildly elevated.  - I have called in a prescribed vit d for her to take for 12 weeks, then she should continue a an OTC supplement of 1000 u daily with a meal after completion of prescribed supplement to maintain levels.

## 2016-09-20 NOTE — Telephone Encounter (Signed)
Patient notified and verbalized understanding. Patient will try low fat, high fiber diet along with fish oil.

## 2016-09-21 LAB — CYTOLOGY - PAP
Diagnosis: NEGATIVE
HPV: NOT DETECTED

## 2016-09-23 ENCOUNTER — Telehealth: Payer: Self-pay | Admitting: Family Medicine

## 2016-09-23 MED ORDER — METRONIDAZOLE 500 MG PO TABS: 500.0000 mg | ORAL_TABLET | Freq: Two times a day (BID) | ORAL | 0 refills | Status: DC

## 2016-09-23 NOTE — Telephone Encounter (Signed)
Please call pt: Her PAP smear is normal. Repeat in 3 years.  - it did have a mild bacteria presentation consistent with bacteria vaginosis (BV). This can cause an odor/discharge, it is not a sexually transmitted disease. It can occur just from a change in Ph or flora.  I have called in flagyl for 7 days, which is the treatment.

## 2016-09-23 NOTE — Telephone Encounter (Signed)
Patient notified and verbalized understanding. 

## 2017-02-17 ENCOUNTER — Other Ambulatory Visit: Payer: Self-pay | Admitting: Family Medicine

## 2017-02-17 DIAGNOSIS — N63 Unspecified lump in unspecified breast: Secondary | ICD-10-CM

## 2017-04-14 ENCOUNTER — Inpatient Hospital Stay: Admission: RE | Admit: 2017-04-14 | Payer: BLUE CROSS/BLUE SHIELD | Source: Ambulatory Visit

## 2017-05-22 ENCOUNTER — Ambulatory Visit
Admission: RE | Admit: 2017-05-22 | Discharge: 2017-05-22 | Disposition: A | Discharge status: Home / Self Care | Payer: BLUE CROSS/BLUE SHIELD | Source: Ambulatory Visit | Attending: Family Medicine | Admitting: Family Medicine

## 2017-05-22 ENCOUNTER — Other Ambulatory Visit: Payer: Self-pay | Admitting: Family Medicine

## 2017-05-22 DIAGNOSIS — N63 Unspecified lump in unspecified breast: Secondary | ICD-10-CM

## 2017-05-22 DIAGNOSIS — R928 Other abnormal and inconclusive findings on diagnostic imaging of breast: Secondary | ICD-10-CM | POA: Diagnosis not present

## 2017-05-22 DIAGNOSIS — N6002 Solitary cyst of left breast: Secondary | ICD-10-CM | POA: Diagnosis not present

## 2018-09-17 DIAGNOSIS — N926 Irregular menstruation, unspecified: Secondary | ICD-10-CM | POA: Diagnosis not present

## 2018-09-17 DIAGNOSIS — N921 Excessive and frequent menstruation with irregular cycle: Secondary | ICD-10-CM | POA: Diagnosis not present

## 2018-09-17 DIAGNOSIS — N879 Dysplasia of cervix uteri, unspecified: Secondary | ICD-10-CM | POA: Diagnosis not present

## 2018-09-17 DIAGNOSIS — N924 Excessive bleeding in the premenopausal period: Secondary | ICD-10-CM | POA: Diagnosis not present

## 2018-09-21 ENCOUNTER — Other Ambulatory Visit (HOSPITAL_COMMUNITY): Payer: Self-pay

## 2018-09-24 ENCOUNTER — Ambulatory Visit (HOSPITAL_COMMUNITY)
Admission: RE | Admit: 2018-09-24 | Discharge: 2018-09-24 | Disposition: A | Discharge status: Home / Self Care | Payer: BLUE CROSS/BLUE SHIELD | Source: Ambulatory Visit | Attending: Obstetrics and Gynecology | Admitting: Obstetrics and Gynecology

## 2018-09-24 MED ORDER — SODIUM CHLORIDE 0.9 % IV SOLN
510.0000 mg | INTRAVENOUS | Status: DC
  Administered 2018-09-24: 510 mg via INTRAVENOUS
  Filled 2018-09-24: qty 510

## 2018-09-24 NOTE — Progress Notes (Signed)
Patient started feeling hot and nauseous as soon as feraheme was started.  Medication was immediately stopped and ns hung. Patient vs stable 112/79, 74, o2 sat.  Patient stated she feels better.  Spoke with ellen at office who will notify Dr Gertie Fey who is out of office today.  Patient will f/ u with office .

## 2018-09-24 NOTE — Discharge Instructions (Signed)

## 2018-09-27 ENCOUNTER — Encounter (HOSPITAL_COMMUNITY): Payer: Self-pay

## 2018-09-28 ENCOUNTER — Encounter (HOSPITAL_COMMUNITY): Payer: Self-pay

## 2018-10-12 DIAGNOSIS — N920 Excessive and frequent menstruation with regular cycle: Secondary | ICD-10-CM | POA: Diagnosis not present

## 2018-10-18 DIAGNOSIS — N920 Excessive and frequent menstruation with regular cycle: Secondary | ICD-10-CM | POA: Diagnosis not present

## 2018-10-18 HISTORY — PX: ENDOMETRIAL ABLATION: SHX621

## 2019-06-20 ENCOUNTER — Other Ambulatory Visit: Payer: Self-pay | Admitting: Family Medicine

## 2019-06-20 DIAGNOSIS — Z1231 Encounter for screening mammogram for malignant neoplasm of breast: Secondary | ICD-10-CM

## 2019-06-21 ENCOUNTER — Telehealth: Payer: Self-pay | Admitting: Family Medicine

## 2019-06-21 ENCOUNTER — Other Ambulatory Visit: Payer: Self-pay | Admitting: Family Medicine

## 2019-06-21 ENCOUNTER — Other Ambulatory Visit: Payer: Self-pay

## 2019-06-21 ENCOUNTER — Ambulatory Visit
Admission: RE | Admit: 2019-06-21 | Discharge: 2019-06-21 | Disposition: A | Discharge status: Home / Self Care | Payer: BC Managed Care – PPO | Source: Ambulatory Visit | Attending: Family Medicine | Admitting: Family Medicine

## 2019-06-21 ENCOUNTER — Encounter: Payer: Self-pay | Admitting: Family Medicine

## 2019-06-21 DIAGNOSIS — Z1231 Encounter for screening mammogram for malignant neoplasm of breast: Secondary | ICD-10-CM

## 2019-06-21 NOTE — Telephone Encounter (Signed)
Patient was called and scheduled CPE for 07/16/2019 @ 8:30am.

## 2019-06-21 NOTE — Telephone Encounter (Signed)
Received a request to sign mammogram orders for patient.  Patient has not been seen by this provider in over 2-1/2 years, will be 3 years in January.  After January she will no longer be considered a patient here (per policy).  I had to decline the orders for her mammogram secondary to she has not been seen.  If she would like to remain a patient here, please have her schedule an appointment as soon as possible for her physical.  If she has establish somewhere else, please remove me as PCP and have them send the mammogram order to that provider. Thank you.

## 2019-07-16 ENCOUNTER — Ambulatory Visit (INDEPENDENT_AMBULATORY_CARE_PROVIDER_SITE_OTHER): Payer: BC Managed Care – PPO | Admitting: Family Medicine

## 2019-07-16 ENCOUNTER — Other Ambulatory Visit: Payer: Self-pay

## 2019-07-16 ENCOUNTER — Encounter: Payer: Self-pay | Admitting: Family Medicine

## 2019-07-16 VITALS — BP 121/83 | HR 80 | Temp 99.7°F | Resp 16 | Ht 63.0 in | Wt 160.5 lb

## 2019-07-16 DIAGNOSIS — Z23 Encounter for immunization: Secondary | ICD-10-CM

## 2019-07-16 DIAGNOSIS — L819 Disorder of pigmentation, unspecified: Secondary | ICD-10-CM

## 2019-07-16 DIAGNOSIS — E663 Overweight: Secondary | ICD-10-CM

## 2019-07-16 DIAGNOSIS — Z131 Encounter for screening for diabetes mellitus: Secondary | ICD-10-CM

## 2019-07-16 DIAGNOSIS — N921 Excessive and frequent menstruation with irregular cycle: Secondary | ICD-10-CM

## 2019-07-16 DIAGNOSIS — E785 Hyperlipidemia, unspecified: Secondary | ICD-10-CM | POA: Diagnosis not present

## 2019-07-16 DIAGNOSIS — Z0000 Encounter for general adult medical examination without abnormal findings: Secondary | ICD-10-CM

## 2019-07-16 DIAGNOSIS — D259 Leiomyoma of uterus, unspecified: Secondary | ICD-10-CM

## 2019-07-16 DIAGNOSIS — N938 Other specified abnormal uterine and vaginal bleeding: Secondary | ICD-10-CM

## 2019-07-16 DIAGNOSIS — E559 Vitamin D deficiency, unspecified: Secondary | ICD-10-CM | POA: Diagnosis not present

## 2019-07-16 LAB — CBC WITH DIFFERENTIAL/PLATELET
Basophils Absolute: 0.1 10*3/uL (ref 0.0–0.1)
Basophils Relative: 1.3 % (ref 0.0–3.0)
Eosinophils Absolute: 0.1 10*3/uL (ref 0.0–0.7)
Eosinophils Relative: 2.5 % (ref 0.0–5.0)
HCT: 41.5 % (ref 36.0–46.0)
Hemoglobin: 13.8 g/dL (ref 12.0–15.0)
Lymphocytes Relative: 32.7 % (ref 12.0–46.0)
Lymphs Abs: 1.8 10*3/uL (ref 0.7–4.0)
MCHC: 33.3 g/dL (ref 30.0–36.0)
MCV: 93.7 fl (ref 78.0–100.0)
Monocytes Absolute: 0.4 10*3/uL (ref 0.1–1.0)
Monocytes Relative: 7.2 % (ref 3.0–12.0)
Neutro Abs: 3.2 10*3/uL (ref 1.4–7.7)
Neutrophils Relative %: 56.3 % (ref 43.0–77.0)
Platelets: 251 10*3/uL (ref 150.0–400.0)
RBC: 4.43 Mil/uL (ref 3.87–5.11)
RDW: 14.6 % (ref 11.5–15.5)
WBC: 5.6 10*3/uL (ref 4.0–10.5)

## 2019-07-16 LAB — COMPREHENSIVE METABOLIC PANEL
ALT: 11 U/L (ref 0–35)
AST: 13 U/L (ref 0–37)
Albumin: 4.5 g/dL (ref 3.5–5.2)
Alkaline Phosphatase: 59 U/L (ref 39–117)
BUN: 11 mg/dL (ref 6–23)
CO2: 28 mEq/L (ref 19–32)
Calcium: 9.3 mg/dL (ref 8.4–10.5)
Chloride: 104 mEq/L (ref 96–112)
Creatinine, Ser: 0.82 mg/dL (ref 0.40–1.20)
GFR: 75.53 mL/min (ref >60.00)
Glucose, Bld: 97 mg/dL (ref 70–99)
Potassium: 4.6 mEq/L (ref 3.5–5.1)
Sodium: 137 mEq/L (ref 135–145)
Total Bilirubin: 0.5 mg/dL (ref 0.2–1.2)
Total Protein: 6.9 g/dL (ref 6.0–8.3)

## 2019-07-16 LAB — TSH: TSH: 1.34 u[IU]/mL (ref 0.35–4.50)

## 2019-07-16 LAB — LIPID PANEL
Cholesterol: 200 mg/dL (ref 0–200)
HDL: 61.6 mg/dL (ref >39.00)
LDL Cholesterol: 120 mg/dL — ABNORMAL HIGH (ref 0–99)
NonHDL: 138.03
Total CHOL/HDL Ratio: 3
Triglycerides: 88 mg/dL (ref 0.0–149.0)
VLDL: 17.6 mg/dL (ref 0.0–40.0)

## 2019-07-16 LAB — HEMOGLOBIN A1C: Hgb A1c MFr Bld: 5.6 % (ref 4.6–6.5)

## 2019-07-16 LAB — VITAMIN D 25 HYDROXY (VIT D DEFICIENCY, FRACTURES): VITD: 29.39 ng/mL — ABNORMAL LOW (ref 30.00–100.00)

## 2019-07-16 NOTE — Progress Notes (Signed)
Patient ID: Ashlee Bean, female  DOB: July 19, 1976, 43 y.o.   MRN: [id number] Patient Care Team    Relationship Specialty Notifications Start End  Ma Hillock, DO PCP - General Family Medicine  08/25/15   Everlene Farrier, MD Consulting Physician Obstetrics and Gynecology  07/16/19   Doran Stabler, MD Consulting Physician Gastroenterology  07/16/19     Chief Complaint  Patient presents with   Annual Exam    mamm 06/21/2019 pap 09/2016. fasting.     Subjective:  Ashlee Bean is a 43 y.o.  female present for new patient establishment. All past medical history, surgical history, allergies, family history, immunizations, medications and social history were updated in the electronic medical record today. All recent labs, ED visits and hospitalizations within the last year were reviewed.  Health maintenance:  Colonoscopy: FHX grandfather in his 55's, screen at 34 Mammogram:06/21/2019 FHX breast cancer in mother at age 23.completed at GYN Cervical cancer screening: 09/2016 at GYN Immunizations: tdap 06/2016 UTD, Influenza administered today (encouraged yearly) Infectious disease screening: HIV completed Assistive device: none Oxygen MGN:OIBB Patient has a Dental home. Hospitalizations/ED visits:reviewed  Depression screen [facility] 2/9 07/16/2019 09/16/2016  Decreased Interest 0 0  Down, Depressed, Hopeless 0 0  PHQ - 2 Score 0 0   No flowsheet data found.   Fall Risk  09/16/2016  Falls in the past year? No   Immunization History  Administered Date(s) Administered   Hep A / Hep B 06/08/2016   Influenza,inj,Quad PF,6+ Mos 08/25/2015   Influenza-Unspecified 09/19/2003   MMR 02/28/1995   Td 01/10/1989, 06/08/2016   Tdap 08/25/2015, 06/08/2016   Typhoid Inactivated 06/08/2016   No exam data present  Past Medical History:  Diagnosis Date   Anemia    after delivery; sometimes also could not give blood.    Allergies  Allergen Reactions   Feraheme  [Ferumoxytol] Nausea Only and Other (See Comments)    Made pt feel hot all over    Sulfa Antibiotics Nausea Only and Nausea And Vomiting   Tape Rash   Past Surgical History:  Procedure Laterality Date   CESAREAN SECTION     Family History  Problem Relation Age of Onset   Breast cancer Mother 70   Lung cancer Mother 27   Diabetes Father    Diabetes Paternal Aunt    Diabetes Paternal Uncle    Cancer Maternal Grandmother    Prostate cancer Maternal Grandfather    Diabetes Paternal Grandmother    Heart disease Paternal Grandfather    Throat cancer Maternal Uncle    Social History   Social History Narrative   Not on file    Allergies as of 07/16/2019      Reactions   Feraheme [ferumoxytol] Nausea Only, Other (See Comments)   Made pt feel hot all over    Sulfa Antibiotics Nausea Only, Nausea And Vomiting   Tape Rash      Medication List       Accurate as of July 16, 2019  8:51 AM. If you have any questions, ask your nurse or doctor.        STOP taking these medications   metroNIDAZOLE 500 MG tablet Commonly known as: FLAGYL Stopped by: Howard Pouch, DO   vitamin B-12 1000 MCG tablet Commonly known as: CYANOCOBALAMIN Stopped by: Howard Pouch, DO   Vitamin D (Ergocalciferol) 1.25 MG (50000 UT) Caps capsule Commonly known as: DRISDOL Stopped by: Howard Pouch, DO      All past  medical history, surgical history, allergies, family history, immunizations andmedications were updated in the EMR today and reviewed under the history and medication portions of their EMR.    No results found for this or any previous visit (from the past 2160 hour(s)).  ROS: 14 pt review of systems performed and negative (unless mentioned in an HPI)  Objective: BP 121/83 (BP Location: Right Arm, Patient Position: Sitting, Cuff Size: Large)    Pulse 80    Temp 99.7 F (37.6 C) (Temporal)    Resp 16    Ht 5' 3"  (1.6 m)    Wt 160 lb 8 oz (72.8 kg)    LMP 06/24/2019 (Exact  Date)    SpO2 98%    BMI 28.43 kg/m  Gen: Afebrile. No acute distress. Nontoxic in appearance, well-developed, well-nourished,  Very pleasant caucasian female. Mildly overweight.  HENT: AT. [place]. Bilateral TM visualized and normal in appearance, normal external auditory canal. MMM, no oral lesions, adequate dentition. Bilateral nares within normal limits. Throat without erythema, ulcerations or exudates. no Cough on exam, no hoarseness on exam. Eyes:Pupils Equal Round Reactive to light, Extraocular movements intact,  Conjunctiva without redness, discharge or icterus. Neck/lymp/endocrine: Supple,no lymphadenopathy, no thyromegaly CV: RRR no murmur, no edema, +2/4 P posterior tibialis pulses. no carotid bruits. No JVD. Chest: CTAB, no wheeze, rhonchi or crackles. normal Respiratory effort. good Air movement. Abd: Soft. flat. NTND. BS present. no Masses palpated. No hepatosplenomegaly. No rebound tenderness or guarding. Skin: no rashes, purpura or petechiae. Warm and well-perfused. Skin intact. 3x4 mm right hyperpigmented/black irregular borders ~ 2 in posterior from frontal hairline.  Neuro/Msk:  Normal gait. PERLA. EOMi. Alert. Oriented x3.  Cranial nerves II through XII intact. Muscle strength 5/5 upper/lower extremity. DTRs equal bilaterally. Psych: Normal affect, dress and demeanor. Normal speech. Normal thought content and judgment.  Assessment/plan: [name] is a 43 y.o. female present for cpe Need for influenza vaccination - Flu Vaccine QUAD 6+ mos PF IM (Fluarix Quad PF) Diabetes mellitus screening - HgB A1c Overweight (BMI 25.0-29.9) Diet and exercise - Lipid Profile Hyperlipidemia, unspecified hyperlipidemia type - CBC w/Diff - Comp Met (CMET) - Lipid Profile - TSH Vitamin D deficiency Not taking supplement routinely - Vitamin D (25 hydroxy)  Menorrhagia with irregular cycle/Dysfunctional uterine bleeding/Uterine leiomyoma, unspecified location Patient has had menorrhagia,  but is not well responded to her ablation.  She is now having more frequent bleeding pattern consistent with dysfunctional uterine bleeding.  She has history of fibroid uterus.  At this point she would like to consider hysterectomy, and if possible she would like to have it scheduled before the end of the year since she has met her deductible. - CBC w/Diff - TSH - Iron, TIBC and Ferritin Panel> not taking supplement routinely.  - Ambulatory referral to Gynecology  Hyperpigmented skin lesion: - rather dark irregular border skin lesion of scalp>> recommend dermatology eval and potential bx. >> referred  Encounter for preventative adult health care examination Patient was encouraged to exercise greater than 150 minutes a week. Patient was encouraged to choose a diet filled with fresh fruits and vegetables, and lean meats. AVS provided to patient today for education/recommendation on gender specific health and safety maintenance. Colonoscopy: FHX grandfather in his 63's, screen at 3 Mammogram:06/21/2019 FHX breast cancer in mother at age 9.completed at GYN Cervical cancer screening: 09/2016 at GYN Immunizations: tdap 06/2016 UTD, Influenza administered today (encouraged yearly) Infectious disease screening: HIV completed  Return in about 1 year (around 07/15/2020) for  CPE (30 min).  Orders Placed This Encounter  Procedures   Flu Vaccine QUAD 6+ mos PF IM (Fluarix Quad PF)   CBC w/Diff   Comp Met (CMET)   HgB A1c   Lipid Profile   TSH   Vitamin D (25 hydroxy)   Iron, TIBC and Ferritin Panel   Ambulatory referral to Gynecology   Ambulatory referral to Dermatology    Note is dictated utilizing voice recognition software. Although note has been proof read prior to signing, occasional typographical errors still can be missed. If any questions arise, please do not hesitate to call for verification.  Electronically signed by: Howard Pouch, DO [place]

## 2019-07-16 NOTE — Patient Instructions (Signed)
Health Maintenance, Female Adopting a healthy lifestyle and getting preventive care are important in promoting health and wellness. Ask your health care provider about:  The right schedule for you to have regular tests and exams.  Things you can do on your own to prevent diseases and keep yourself healthy. What should I know about diet, weight, and exercise? Eat a healthy diet   Eat a diet that includes plenty of vegetables, fruits, low-fat dairy products, and lean protein.  Do not eat a lot of foods that are high in solid fats, added sugars, or sodium. Maintain a healthy weight Body mass index (BMI) is used to identify weight problems. It estimates body fat based on height and weight. Your health care provider can help determine your BMI and help you achieve or maintain a healthy weight. Get regular exercise Get regular exercise. This is one of the most important things you can do for your health. Most adults should:  Exercise for at least 150 minutes each week. The exercise should increase your heart rate and make you sweat (moderate-intensity exercise).  Do strengthening exercises at least twice a week. This is in addition to the moderate-intensity exercise.  Spend less time sitting. Even light physical activity can be beneficial. Watch cholesterol and blood lipids Have your blood tested for lipids and cholesterol at 44 years of age, then have this test every 5 years. Have your cholesterol levels checked more often if:  Your lipid or cholesterol levels are high.  You are older than 44 years of age.  You are at high risk for heart disease. What should I know about cancer screening? Depending on your health history and family history, you may need to have cancer screening at various ages. This may include screening for:  Breast cancer.  Cervical cancer.  Colorectal cancer.  Skin cancer.  Lung cancer. What should I know about heart disease, diabetes, and high blood  pressure? Blood pressure and heart disease  High blood pressure causes heart disease and increases the risk of stroke. This is more likely to develop in people who have high blood pressure readings, are of African descent, or are overweight.  Have your blood pressure checked: ? Every 3-5 years if you are 18-39 years of age. ? Every year if you are 40 years old or older. Diabetes Have regular diabetes screenings. This checks your fasting blood sugar level. Have the screening done:  Once every three years after age 40 if you are at a normal weight and have a low risk for diabetes.  More often and at a younger age if you are overweight or have a high risk for diabetes. What should I know about preventing infection? Hepatitis B If you have a higher risk for hepatitis B, you should be screened for this virus. Talk with your health care provider to find out if you are at risk for hepatitis B infection. Hepatitis C Testing is recommended for:  Everyone born from 1945 through 1965.  Anyone with known risk factors for hepatitis C. Sexually transmitted infections (STIs)  Get screened for STIs, including gonorrhea and chlamydia, if: ? You are sexually active and are younger than 44 years of age. ? You are older than 44 years of age and your health care provider tells you that you are at risk for this type of infection. ? Your sexual activity has changed since you were last screened, and you are at increased risk for chlamydia or gonorrhea. Ask your health care provider if   you are at risk.  Ask your health care provider about whether you are at high risk for HIV. Your health care provider may recommend a prescription medicine to help prevent HIV infection. If you choose to take medicine to prevent HIV, you should first get tested for HIV. You should then be tested every 3 months for as long as you are taking the medicine. Pregnancy  If you are about to stop having your period (premenopausal) and  you may become pregnant, seek counseling before you get pregnant.  Take 400 to 800 micrograms (mcg) of folic acid every day if you become pregnant.  Ask for birth control (contraception) if you want to prevent pregnancy. Osteoporosis and menopause Osteoporosis is a disease in which the bones lose minerals and strength with aging. This can result in bone fractures. If you are 65 years old or older, or if you are at risk for osteoporosis and fractures, ask your health care provider if you should:  Be screened for bone loss.  Take a calcium or vitamin D supplement to lower your risk of fractures.  Be given hormone replacement therapy (HRT) to treat symptoms of menopause. Follow these instructions at home: Lifestyle  Do not use any products that contain nicotine or tobacco, such as cigarettes, e-cigarettes, and chewing tobacco. If you need help quitting, ask your health care provider.  Do not use street drugs.  Do not share needles.  Ask your health care provider for help if you need support or information about quitting drugs. Alcohol use  Do not drink alcohol if: ? Your health care provider tells you not to drink. ? You are pregnant, may be pregnant, or are planning to become pregnant.  If you drink alcohol: ? Limit how much you use to 0-1 drink a day. ? Limit intake if you are breastfeeding.  Be aware of how much alcohol is in your drink. In the [place] one drink equals one 12 oz bottle of beer (355 mL), one 5 oz glass of wine (148 mL), or one 1 oz glass of hard liquor (44 mL). General instructions  Schedule regular health, dental, and eye exams.  Stay current with your vaccines.  Tell your health care provider if: ? You often feel depressed. ? You have ever been abused or do not feel safe at home. Summary  Adopting a healthy lifestyle and getting preventive care are important in promoting health and wellness.  Follow your health care provider's instructions about healthy  diet, exercising, and getting tested or screened for diseases.  Follow your health care provider's instructions on monitoring your cholesterol and blood pressure. This information is not intended to replace advice given to you by your health care provider. Make sure you discuss any questions you have with your health care provider. Document Released: 03/07/2011 Document Revised: 08/15/2018 Document Reviewed: 08/15/2018 Elsevier Patient Education  2020 [employer]  

## 2019-07-17 ENCOUNTER — Telehealth: Payer: Self-pay | Admitting: Family Medicine

## 2019-07-17 LAB — IRON,TIBC AND FERRITIN PANEL
%SAT: 21 % (calc) (ref 16–45)
Ferritin: 9 ng/mL — ABNORMAL LOW (ref 16–232)
Iron: 83 ug/dL (ref 40–190)
TIBC: 396 mcg/dL (calc) (ref 250–450)

## 2019-07-17 MED ORDER — POLYSACCHARIDE IRON COMPLEX 150 MG PO CAPS: ORAL_CAPSULE | ORAL | 3 refills | Status: AC

## 2019-07-17 NOTE — Telephone Encounter (Signed)
Please inform patient the following information: -Her electrolytes, liver, kidney function, cell counts, thyroid are all in normal range and functioning normal.  -Her diabetes screen is in normal range.  -Her vitamin D is just shy of normal range at 29.4, normal is over 30 and ideally for bone health should strive for a goal of 40-50.>>  Would encourage her to start over-the-counter vitamin D3 600-800 units daily.  Her cholesterol looks good with a total cholesterol of 200, good/HDL cholesterol of 61 and bad/LDL cholesterol at 120.  Triglycerides normal.  Her iron is in normal range, however her iron stores/ferritin are depleted and abnormal at 9 (normal is above 16, and patients can  have iron deficiency symptoms when below 50).  I have called in a different type of iron for her to try.  Attempt to take on 3 days a week such as Monday Wednesday Friday.  An iron rich diet can also be helpful, red meat, pork and poultry are good sources of iron and good plant sources of iron are lentils, chickpeas, beans, tofu, cashew nuts, pumpkin seeds, kale, dried apricots and figs, raisins, quinoa and fortified breakfast cereal. -Hopefully once her heavy menstrual cycles are taking care of, she will not need to continue the iron long-term.

## 2019-07-17 NOTE — Telephone Encounter (Signed)
Pt was called and given information, she verbalizes understanding  

## 2019-08-07 DIAGNOSIS — N939 Abnormal uterine and vaginal bleeding, unspecified: Secondary | ICD-10-CM | POA: Diagnosis not present

## 2019-08-07 DIAGNOSIS — N946 Dysmenorrhea, unspecified: Secondary | ICD-10-CM | POA: Diagnosis not present

## 2019-08-08 ENCOUNTER — Encounter: Payer: Self-pay | Admitting: Family Medicine

## 2019-08-08 DIAGNOSIS — D2372 Other benign neoplasm of skin of left lower limb, including hip: Secondary | ICD-10-CM | POA: Diagnosis not present

## 2019-08-08 DIAGNOSIS — L920 Granuloma annulare: Secondary | ICD-10-CM | POA: Diagnosis not present

## 2019-08-08 DIAGNOSIS — D224 Melanocytic nevi of scalp and neck: Secondary | ICD-10-CM | POA: Diagnosis not present

## 2019-11-13 DIAGNOSIS — N951 Menopausal and female climacteric states: Secondary | ICD-10-CM | POA: Diagnosis not present

## 2019-11-13 DIAGNOSIS — E611 Iron deficiency: Secondary | ICD-10-CM | POA: Diagnosis not present

## 2019-11-13 DIAGNOSIS — E7800 Pure hypercholesterolemia, unspecified: Secondary | ICD-10-CM | POA: Diagnosis not present

## 2019-11-13 DIAGNOSIS — R635 Abnormal weight gain: Secondary | ICD-10-CM | POA: Diagnosis not present

## 2019-11-13 DIAGNOSIS — R7303 Prediabetes: Secondary | ICD-10-CM | POA: Diagnosis not present

## 2019-11-13 DIAGNOSIS — E559 Vitamin D deficiency, unspecified: Secondary | ICD-10-CM | POA: Diagnosis not present

## 2019-11-13 DIAGNOSIS — R7309 Other abnormal glucose: Secondary | ICD-10-CM | POA: Diagnosis not present

## 2019-11-13 DIAGNOSIS — R5382 Chronic fatigue, unspecified: Secondary | ICD-10-CM | POA: Diagnosis not present

## 2019-11-18 DIAGNOSIS — R7303 Prediabetes: Secondary | ICD-10-CM | POA: Diagnosis not present

## 2019-11-18 DIAGNOSIS — N951 Menopausal and female climacteric states: Secondary | ICD-10-CM | POA: Diagnosis not present

## 2019-11-18 DIAGNOSIS — E559 Vitamin D deficiency, unspecified: Secondary | ICD-10-CM | POA: Diagnosis not present

## 2019-11-18 DIAGNOSIS — Z1331 Encounter for screening for depression: Secondary | ICD-10-CM | POA: Diagnosis not present

## 2019-11-18 DIAGNOSIS — Z1339 Encounter for screening examination for other mental health and behavioral disorders: Secondary | ICD-10-CM | POA: Diagnosis not present

## 2019-11-18 DIAGNOSIS — Z6827 Body mass index (BMI) 27.0-27.9, adult: Secondary | ICD-10-CM | POA: Diagnosis not present

## 2020-01-17 DIAGNOSIS — Z1239 Encounter for other screening for malignant neoplasm of breast: Secondary | ICD-10-CM | POA: Diagnosis not present

## 2020-01-17 DIAGNOSIS — N939 Abnormal uterine and vaginal bleeding, unspecified: Secondary | ICD-10-CM | POA: Diagnosis not present

## 2020-01-17 DIAGNOSIS — Z01419 Encounter for gynecological examination (general) (routine) without abnormal findings: Secondary | ICD-10-CM | POA: Diagnosis not present

## 2020-01-17 DIAGNOSIS — Z3040 Encounter for surveillance of contraceptives, unspecified: Secondary | ICD-10-CM | POA: Diagnosis not present

## 2020-01-17 DIAGNOSIS — Z6825 Body mass index (BMI) 25.0-25.9, adult: Secondary | ICD-10-CM | POA: Diagnosis not present

## 2020-02-10 DIAGNOSIS — Z20822 Contact with and (suspected) exposure to covid-19: Secondary | ICD-10-CM | POA: Diagnosis not present

## 2020-06-08 DIAGNOSIS — Z124 Encounter for screening for malignant neoplasm of cervix: Secondary | ICD-10-CM | POA: Diagnosis not present

## 2020-06-08 DIAGNOSIS — N946 Dysmenorrhea, unspecified: Secondary | ICD-10-CM | POA: Diagnosis not present

## 2020-06-08 DIAGNOSIS — Z3040 Encounter for surveillance of contraceptives, unspecified: Secondary | ICD-10-CM | POA: Diagnosis not present

## 2020-06-08 DIAGNOSIS — Z01419 Encounter for gynecological examination (general) (routine) without abnormal findings: Secondary | ICD-10-CM | POA: Diagnosis not present

## 2020-06-18 DIAGNOSIS — R194 Change in bowel habit: Secondary | ICD-10-CM | POA: Diagnosis not present

## 2020-06-18 DIAGNOSIS — D509 Iron deficiency anemia, unspecified: Secondary | ICD-10-CM | POA: Diagnosis not present

## 2020-06-18 DIAGNOSIS — Z1211 Encounter for screening for malignant neoplasm of colon: Secondary | ICD-10-CM | POA: Diagnosis not present

## 2020-06-18 DIAGNOSIS — R140 Abdominal distension (gaseous): Secondary | ICD-10-CM | POA: Diagnosis not present

## 2020-06-18 LAB — CBC AND DIFFERENTIAL
HCT: 45 (ref 36–46)
Hemoglobin: 15.5 (ref 12.0–16.0)
Platelets: 258 (ref 150–399)
WBC: 5

## 2020-06-18 LAB — COMPREHENSIVE METABOLIC PANEL
Albumin: 4.6 (ref 3.5–5.0)
Calcium: 10.5 (ref 8.7–10.7)
GFR calc Af Amer: 82
GFR calc non Af Amer: 71

## 2020-06-18 LAB — BASIC METABOLIC PANEL
BUN: 16 (ref 4–21)
CO2: 22 (ref 13–22)
Chloride: 102 (ref 99–108)
Creatinine: 1 (ref 0.5–1.1)
Glucose: 93
Potassium: 4.8 (ref 3.4–5.3)
Sodium: 140 (ref 137–147)

## 2020-06-18 LAB — HEPATIC FUNCTION PANEL
ALT: 27 (ref 7–35)
AST: 27 (ref 13–35)
Alkaline Phosphatase: 55 (ref 25–125)
Bilirubin, Direct: 0.11 (ref 0.01–0.4)
Bilirubin, Total: 0.3

## 2020-06-18 LAB — CBC: RBC: 4.74 (ref 3.87–5.11)

## 2020-06-18 LAB — TSH: TSH: 0.93 (ref 0.41–5.90)

## 2020-06-24 ENCOUNTER — Encounter: Payer: Self-pay | Admitting: Family Medicine

## 2020-07-06 ENCOUNTER — Telehealth: Payer: Self-pay | Admitting: Family Medicine

## 2020-07-06 ENCOUNTER — Other Ambulatory Visit: Payer: Self-pay | Admitting: Family Medicine

## 2020-07-06 DIAGNOSIS — Z1231 Encounter for screening mammogram for malignant neoplasm of breast: Secondary | ICD-10-CM

## 2020-07-06 NOTE — Telephone Encounter (Signed)
Please advise message below  °

## 2020-07-06 NOTE — Telephone Encounter (Signed)
Patient had appt with [facility] on 06/18/20 (see scanned visit report with lab results). She wants to know if Dr. Raoul Pitch has reviewed these labs and if she needs an appt due to any abnormalities.

## 2020-07-07 NOTE — Telephone Encounter (Signed)
Usual practice is ordering provider will review labs with patient and send [place] a copy for our records. We typically do not review labs ordered by another provider  in detail,  unless pt is directed to follow up with pcp for a concern (by appt).  In a quick review for her now, it appears her calcium was mildly elevated.  She is due for her physical 07/16/2020> I would recommend she schedule her physical and we can repeat the lab at that time and discuss.

## 2020-07-08 NOTE — Telephone Encounter (Signed)
Spoke with pt to give information below. Pt is aware that calcium is mildly elevated and was informed to she can have labs repeated once she schedule her CPE that is due. Pt stated that she is disappointed that the provider cannot review the labs in further details and give the next step. Pt was informed she will need to contact the ordering provider. Pt was offered to schedule an appointment so we can then repeat labs with extended labs needed for physical. Pt stated she is disappointed with provider and will not be sched appointment and then disconnected call.  Provider aware of conversation nature.   FYI

## 2020-08-13 ENCOUNTER — Ambulatory Visit
Admission: RE | Admit: 2020-08-13 | Discharge: 2020-08-13 | Disposition: A | Discharge status: Home / Self Care | Payer: BC Managed Care – PPO | Source: Ambulatory Visit | Attending: Family Medicine | Admitting: Family Medicine

## 2020-08-13 ENCOUNTER — Other Ambulatory Visit: Payer: Self-pay

## 2020-08-13 DIAGNOSIS — Z1231 Encounter for screening mammogram for malignant neoplasm of breast: Secondary | ICD-10-CM | POA: Diagnosis not present

## 2020-08-17 ENCOUNTER — Other Ambulatory Visit: Payer: Self-pay | Admitting: Family Medicine

## 2020-08-17 DIAGNOSIS — N6489 Other specified disorders of breast: Secondary | ICD-10-CM

## 2020-08-26 ENCOUNTER — Ambulatory Visit
Admission: RE | Admit: 2020-08-26 | Discharge: 2020-08-26 | Disposition: A | Discharge status: Home / Self Care | Payer: BC Managed Care – PPO | Source: Ambulatory Visit | Attending: Family Medicine | Admitting: Family Medicine

## 2020-08-26 ENCOUNTER — Other Ambulatory Visit: Payer: Self-pay

## 2020-08-26 DIAGNOSIS — N6489 Other specified disorders of breast: Secondary | ICD-10-CM

## 2020-08-26 DIAGNOSIS — R928 Other abnormal and inconclusive findings on diagnostic imaging of breast: Secondary | ICD-10-CM | POA: Diagnosis not present

## 2021-12-17 IMAGING — MG DIGITAL SCREENING BILAT W/ TOMO W/ CAD
8 series · 9 of 24 positions shown · non-contrast
Comparison: Previous exam(s).

CLINICAL DATA: Screening.

EXAM:
DIGITAL SCREENING BILATERAL MAMMOGRAM WITH TOMO AND CAD

[L MLO synth-2D]
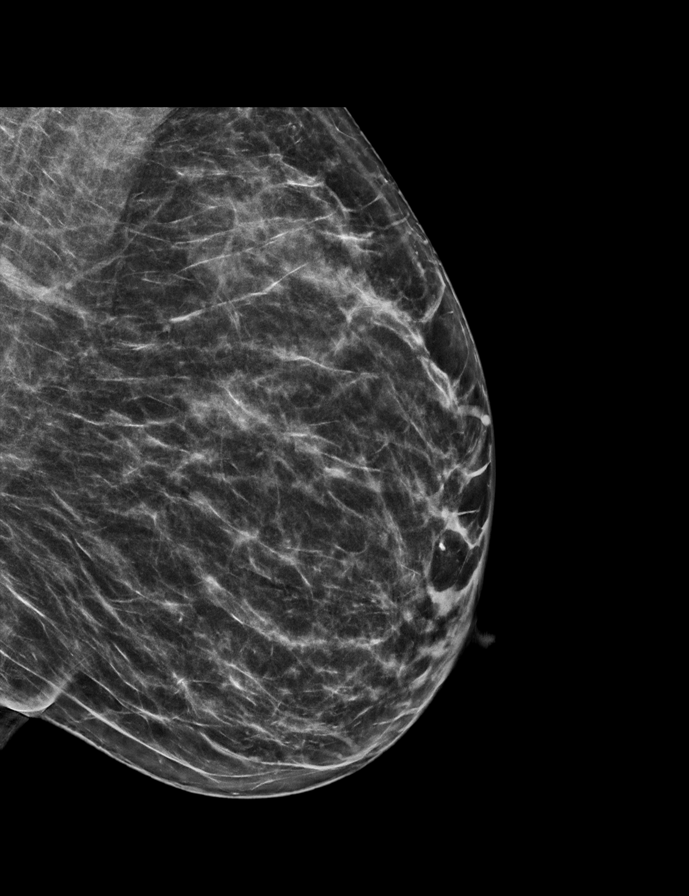

[R CC synth-2D]
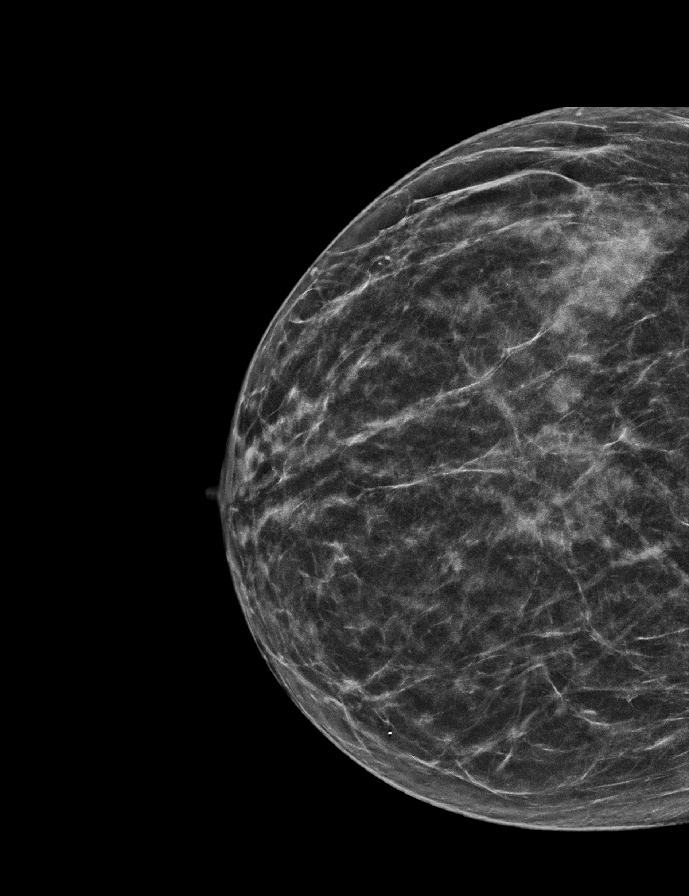

[R MLO synth-2D]
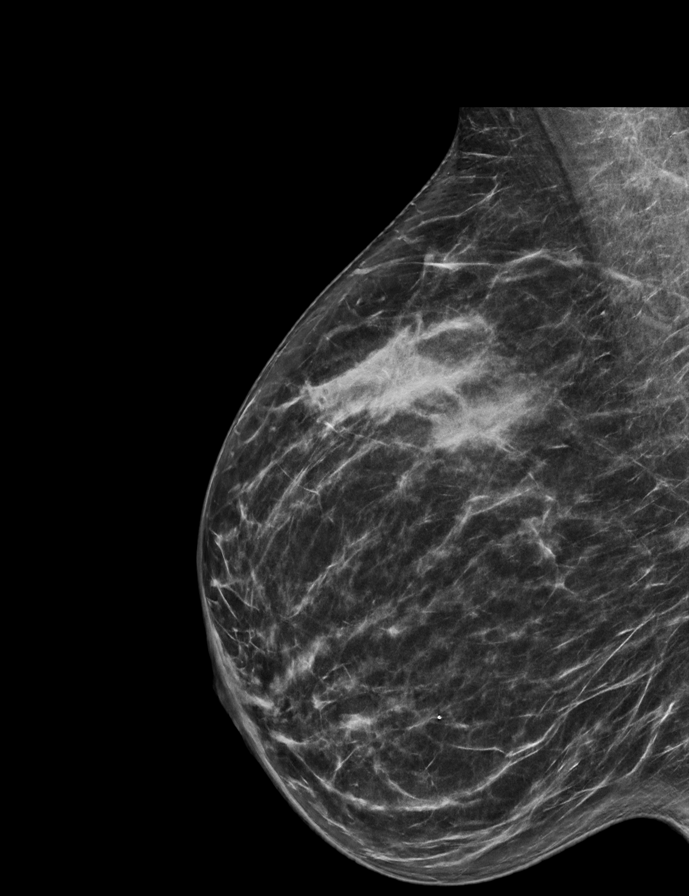

[L CC synth-2D]
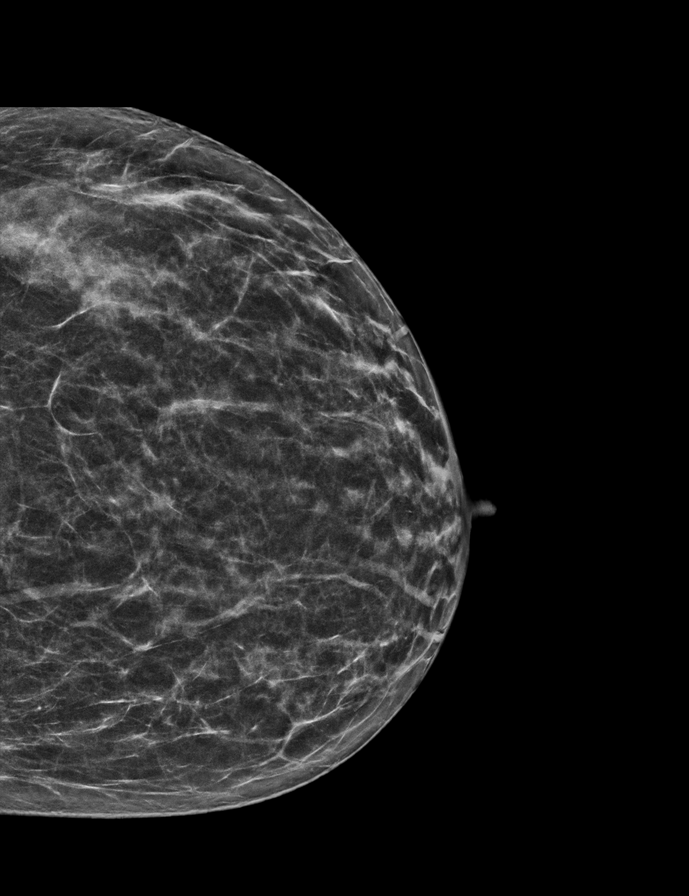

[R MLO tomo · 2 of 65 frames shown]
[frame 21/65]
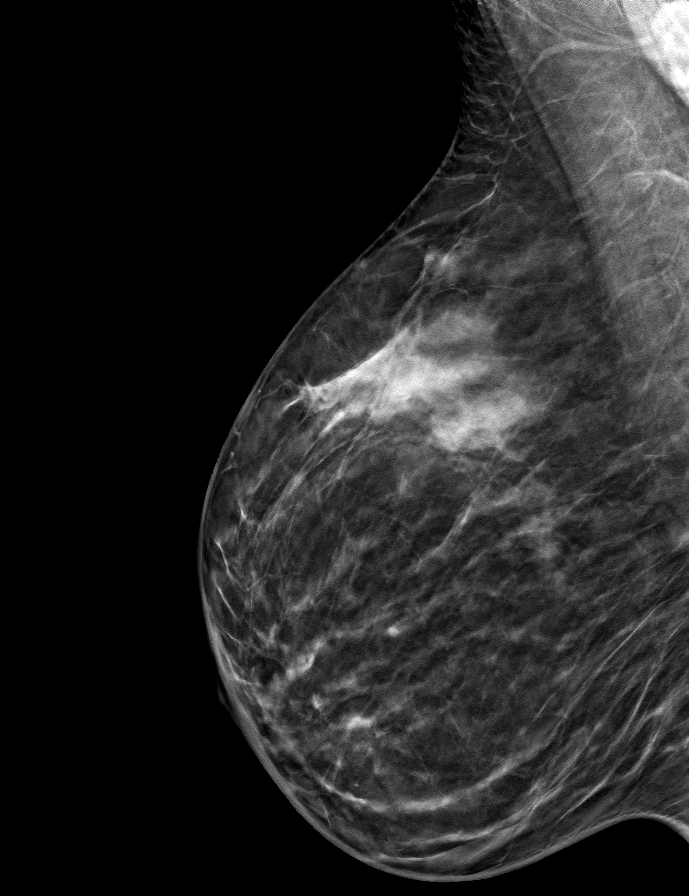
[frame 33/65]
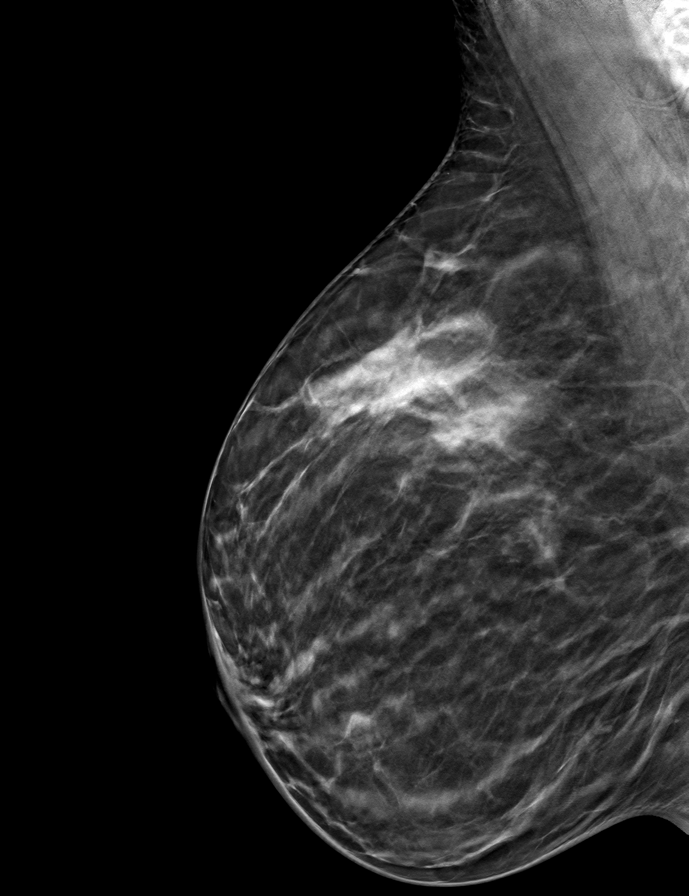

[R CC tomo · tomo slice 28/55.0]
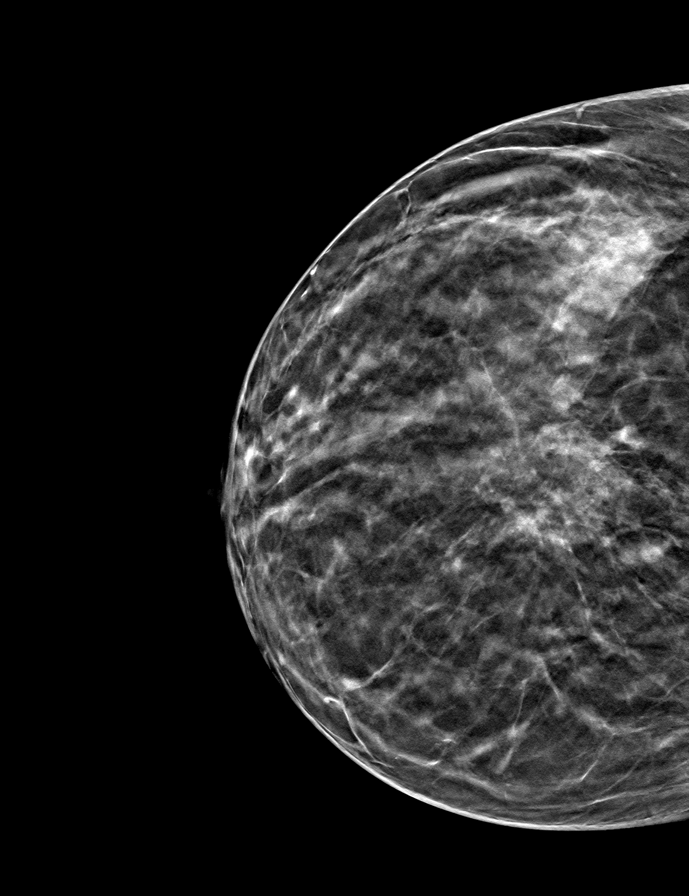

[L MLO tomo · tomo slice 31/60.0]
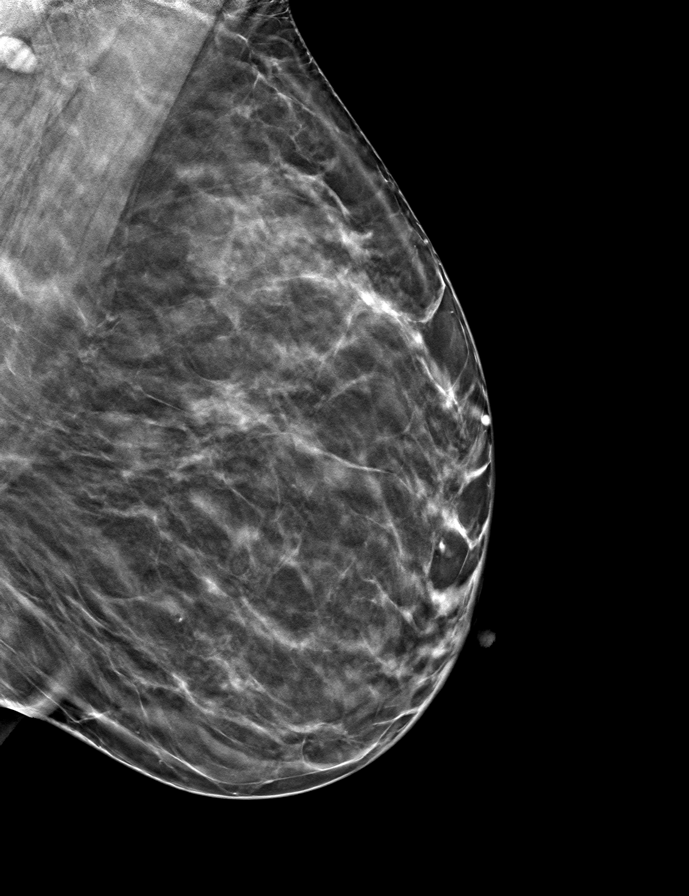

[L CC tomo · tomo slice 27/54.0]
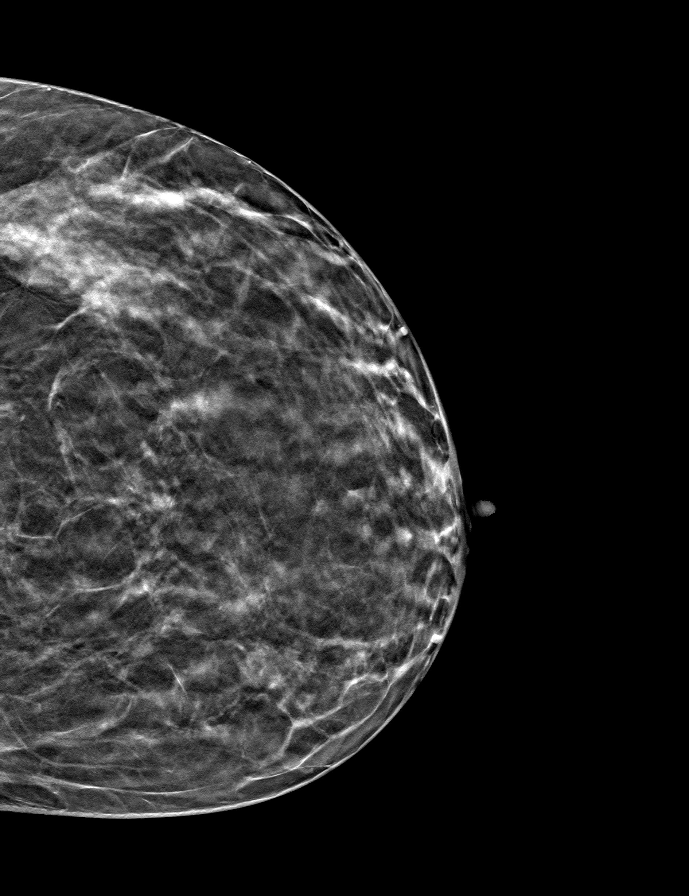

[9 of 24 positions shown; findings below may reference images not displayed]

ACR Breast Density Category b: There are scattered areas of
fibroglandular density.
FINDINGS: In the right breast, a possible asymmetry warrants further
evaluation. In the left breast, no findings suspicious for
malignancy. Images were processed with CAD.
IMPRESSION: Further evaluation is suggested for possible asymmetry in the right
breast.

RECOMMENDATION:
Diagnostic mammogram and possibly ultrasound of the right breast.
(Code:[id number])

The patient will be contacted regarding the findings, and additional
imaging will be scheduled.

BI-RADS CATEGORY  0: Incomplete. Need additional imaging evaluation
and/or prior mammograms for comparison.

## 2022-01-03 LAB — HM COLONOSCOPY

## 2024-06-11 ENCOUNTER — Other Ambulatory Visit: Payer: Self-pay | Admitting: Obstetrics and Gynecology

## 2024-06-11 DIAGNOSIS — Z1231 Encounter for screening mammogram for malignant neoplasm of breast: Secondary | ICD-10-CM
